# Patient Record
Sex: Female | Born: 1965 | Race: White | Hispanic: No | Marital: Married | Smoking: Never smoker
Health system: Southern US, Community
[De-identification: ages and names within clinical notes are randomized; demographics above are authoritative.]

## PROBLEM LIST (undated history)

## (undated) DIAGNOSIS — K219 Gastro-esophageal reflux disease without esophagitis: Secondary | ICD-10-CM

## (undated) DIAGNOSIS — A879 Viral meningitis, unspecified: Secondary | ICD-10-CM

## (undated) DIAGNOSIS — F419 Anxiety disorder, unspecified: Secondary | ICD-10-CM

## (undated) DIAGNOSIS — T7840XA Allergy, unspecified, initial encounter: Secondary | ICD-10-CM

## (undated) HISTORY — DX: Viral meningitis, unspecified: A87.9

## (undated) HISTORY — DX: Anxiety disorder, unspecified: F41.9

## (undated) HISTORY — DX: Gastro-esophageal reflux disease without esophagitis: K21.9

## (undated) HISTORY — DX: Allergy, unspecified, initial encounter: T78.40XA

---

## 1998-10-25 ENCOUNTER — Inpatient Hospital Stay (HOSPITAL_COMMUNITY): Admission: AD | Admit: 1998-10-25 | Discharge: 1998-10-25 | Payer: Self-pay | Admitting: *Deleted

## 1998-12-25 ENCOUNTER — Inpatient Hospital Stay (HOSPITAL_COMMUNITY): Admission: AD | Admit: 1998-12-25 | Discharge: 1998-12-28 | Payer: Self-pay | Admitting: Obstetrics and Gynecology

## 2000-01-06 ENCOUNTER — Other Ambulatory Visit: Admission: RE | Admit: 2000-01-06 | Discharge: 2000-01-06 | Payer: Self-pay | Admitting: Obstetrics and Gynecology

## 2001-01-07 ENCOUNTER — Other Ambulatory Visit: Admission: RE | Admit: 2001-01-07 | Discharge: 2001-01-07 | Payer: Self-pay | Admitting: Obstetrics and Gynecology

## 2002-04-25 ENCOUNTER — Other Ambulatory Visit: Admission: RE | Admit: 2002-04-25 | Discharge: 2002-04-25 | Payer: Self-pay | Admitting: Obstetrics and Gynecology

## 2003-08-09 ENCOUNTER — Other Ambulatory Visit: Admission: RE | Admit: 2003-08-09 | Discharge: 2003-08-09 | Payer: Self-pay | Admitting: Obstetrics and Gynecology

## 2004-08-14 ENCOUNTER — Other Ambulatory Visit: Admission: RE | Admit: 2004-08-14 | Discharge: 2004-08-14 | Payer: Self-pay | Admitting: Obstetrics and Gynecology

## 2004-11-10 DIAGNOSIS — A879 Viral meningitis, unspecified: Secondary | ICD-10-CM

## 2004-11-10 HISTORY — DX: Viral meningitis, unspecified: A87.9

## 2005-09-29 ENCOUNTER — Inpatient Hospital Stay (HOSPITAL_COMMUNITY): Admission: EM | Admit: 2005-09-29 | Discharge: 2005-10-01 | Payer: Self-pay | Admitting: Emergency Medicine

## 2005-09-29 ENCOUNTER — Ambulatory Visit: Payer: Self-pay | Admitting: Internal Medicine

## 2006-05-15 ENCOUNTER — Other Ambulatory Visit: Admission: RE | Admit: 2006-05-15 | Discharge: 2006-05-15 | Payer: Self-pay | Admitting: Obstetrics and Gynecology

## 2008-09-18 ENCOUNTER — Ambulatory Visit: Payer: Self-pay | Admitting: Obstetrics and Gynecology

## 2008-09-18 ENCOUNTER — Encounter: Payer: Self-pay | Admitting: Obstetrics and Gynecology

## 2008-09-18 ENCOUNTER — Other Ambulatory Visit: Admission: RE | Admit: 2008-09-18 | Discharge: 2008-09-18 | Payer: Self-pay | Admitting: Obstetrics and Gynecology

## 2008-09-19 ENCOUNTER — Encounter: Admission: RE | Admit: 2008-09-19 | Discharge: 2008-09-19 | Payer: Self-pay | Admitting: Obstetrics and Gynecology

## 2010-09-09 ENCOUNTER — Encounter: Admission: RE | Admit: 2010-09-09 | Discharge: 2010-09-09 | Payer: Self-pay | Admitting: Obstetrics and Gynecology

## 2010-12-04 ENCOUNTER — Ambulatory Visit: Admit: 2010-12-04 | Payer: Self-pay | Admitting: Obstetrics and Gynecology

## 2011-03-28 NOTE — Discharge Summary (Signed)
NAME:  Michelle Love, Michelle Love               ACCOUNT NO.:  [id number]   MEDICAL RECORD NO.:  [id number]          PATIENT TYPE:  INP   LOCATION:  5509                         FACILITY:  [facility]   PHYSICIAN:  Michelle Love, M.D.DATE OF BIRTH:  Nov 04, 1966   DATE OF ADMISSION:  09/29/2005  DATE OF DISCHARGE:  10/01/2005                                 DISCHARGE SUMMARY   DISCHARGE DIAGNOSES:  1.  Aseptic meningitis.  2.  Hypokalemia secondary to vomiting, resolved.  3.  Normocytic anemia.   DISCHARGE MEDICATIONS:  1.  Valtrex 1 gram t.i.d. x7 days or until notified by M.D.  2.  Vicodin 500/5, one to two tabs every four to six hours as needed for      pain.  3.  Phenergan 12.5 mg one every four to six hours as needed for nausea.   The patient is instructed to follow up with the [facility]  within a week and also if feeling acutely worse return to the emergency room  ASAP, and also if the patient has any questions she has been given my  personal pager and also the pager of the on call resident to contact [place].   PROCEDURES:  1.  On September 29, 2005, a chest x-ray was performed which showed no active      cardiopulmonary disease.  2.  On September 29, 2005, also a CT of the head was performed without      contrast which showed no acute intracranial abnormality.  3.  On September 29, 2005, a lumbar puncture was performed which showed the      following:  CSF glucose of 54, total protein 51, gram stain of the CSF      showing white blood cells present both PMN and mononuclear cells, no      organisms seen.  A cell count differential was performed on tube 1.  The      CSF was colorless and clear with a white blood cell count of 38, red      blood cell count of 93, segmented neutrophils of 38%, lymphocytes 60%,      monocytes 10%.  No eosinophils or other cells.  A cell count and      differential was performed also on tube  four.  CSF in tube four was      colorless and clear, white  blood cell count 62, red blood cell count 2,      segmented neutrophils 25%, lymphocytes 59%, monos/macrophages 13%,      eosinophils 3%.  A culture of the CSF was performed which the      preliminary growth shows white blood cells present both PMNs and      mononuclears, no organisms seen and it is preliminary.  PCR for      enterococcus, HIV, and HSV were performed on the CSF and those are all      pending.   CONSULTATIONS:  No formal consultations were done, although infectious  disease was curb sided on this case and they gave some recommendations  informally.   HISTORY AND PHYSICAL:  For a full H&P, please consult the chart but in  brief, [name] is a 45 year old white woman who presented to the  emergency room with a 2-day history of severe headache, beginning the  morning of September 27, 2005, that responded only mildly to 600 mg Advil  q.4h. but then not at all.  The headache was constant and frontal and  worsened at times, especially when laying down and was associated with light  sensitivity and some nausea beginning on the prior night but no vomiting.  She felt like she had a low-grade fever subjectively and minor chills but  did not measure her temperature.  She denied diarrhea or constipation.  No  vision changes, no numbness or tingling, or focal weakness.  She felt  generally weak all over and dizzy.  She had not eaten since the night prior  to admission.  Her 69-year-old daughter was diagnosed with strep throat the  week prior to admission and treated with Zithromax.  The patient herself had  a cold one week prior which lasted a week but did not cause her to miss  work, and she denies cough or sneezes.  She has had cold sores rarely in the  past but none recently.   PAST MEDICAL HISTORY:  Noncontributory.  She has had a C-section, vaginal  birth, wisdom teeth extraction, and her last menstrual period was September 22, 2005, and ended a few days prior to admission, and  she does use tampons.   PHYSICAL EXAMINATION:  VITAL SIGNS:  Temperature 97.5, blood pressure 86 to  114 over 51 to 77, pulse 67 to 84, respirations 18 to 22, saturating 100% on  room air.  GENERAL:  She was alert and oriented, mild distress, pleasant and  conversive.  HEENT:  Extraocular muscles were intact.  Pupils equal, round, reactive to  light and accommodation.  Visual fields were intact.  Oropharynx was clear.  Head, she said that when she flexed her neck, although she has full range of  motion her head did hurt.  LUNGS:  Coarse breath sounds with scant wheezes.  HEART:  Rate was regular and so was the rhythm.  No murmurs, rubs, or  gallops.  ABDOMEN:  Nontender, nondistended.  Decreased bowel sounds.  EXTREMITIES:  No pitting bilaterally.  GU:  Not performed.  SKIN:  Warm and dry, except hands were cool and moist.  There were no  rashes, no petechiae noted.  MUSCULOSKELETAL:  She had 5/5 strength in the upper and lower extremities  bilaterally.  NEUROLOGIC:  Cranial nerves II-XII are intact.  Sensation was intact  bilaterally.  She had 1+ deep tendon reflexes in bilateral upper and lower  extremities.  PSYCH:  She was appropriate.   She was influenza A and B negative.  Sodium 135, potassium 3.5, chloride  103, bicarb 25, BUN 9, creatinine 0.7, glucose 97.  Bilirubin 0.5, alk phos  62, AST 16, ALT 10, protein 6.4, albumin 3.6, calcium 8.2.  Hemoglobin 11.7,  hematocrit 34.5, MCV 85.1, white count 5.6, ANC 4 with 71% neutrophils, 16%  lymphocytes, and 12% monocytes.  She had 212 platelets and please refer to  the procedure section for details about the CSF analysis.   HOSPITAL COURSE:  1.  [name] presented to the emergency room with these symptoms and      received a spinal tap and the CSF had already been analyzed by the time      we saw her and it was  felt to be most consistent with aseptic     meningitis, although there was a Preast bit of question because of the       distribution or the breakdown of her white blood cells.  Nevertheless,      she had no focal neurologic signs, was afebrile, remained afebrile      during the hospitalization.  The differential diagnoses for aseptic      meningitis included viral causes namely adenovirus and she had a cold      before but also included HSV, HIV, mumps, CMV, EDV, and non-viral causes      like TB or spirochetal pathogens as well as autoimmune causes which were      felt to be unlikely considering no other symptoms were present.  She did      receive Rocephin in the emergency room and that was 2 grams IV but we      decided not to continue antibiotics because there was no evidence of      bacterial meningitis.  We consulted infectious disease in an informal      fashion and they felt that her presentation was not consistent with      bacterial meningitis.  However, because of the severity of such a      possibility the patient has been provided with two pager numbers for the      team in case she should develop any questions and has been counseled to      return to the ED immediately should she get acutely worse.  She was      treated throughout the hospitalization with acyclovir IV and was      discharged with Valtrex.  The only symptoms she had during her      hospitalization was a headache which was very intense upon presentation      but decreased during the hospitalization and improved with ibuprofen at      the very end.  She did use Vicodin but felt like the ibuprofen was      sufficient.  The plan is to follow up on the CSF culture and on the PCR      for HIV, HSV, and enterovirus and to call her with these results.  If      HSV is negative, I will instruct her to discontinue the Valtrex.  We did      place a PPD and it will be read on October 01, 2005, and she will call      [place] if it is positive.  She is going to have a nurse or doctor read it,      and she will contact our pager if it is  reactive.  2.  Hypokalemia.  This was felt to be secondary to the patient's episodes of      vomiting, and she did vomit on the first night of admission and felt      that it was secondary to the pain medication.  The differential also      included vomiting secondary to the meningitis and it was difficult to      know which one was at fault but she did respond well to Zofran and did      not vomit thereafter, although she did have episodes of nausea.  She      plans to have the Vicodin available and in fact, I discharged on Vicodin      but will try to  control the headache with ibuprofen.  She remained      afebrile throughout the entire hospitalization.  On the day of      discharge, Ms. Totten said her headache had worsened overnight and she      took some Vicodin and felt nauseated but did not vomit but felt much      better this morning and was ready to go home. 3.  Normocytic anemia.  The patient was found to have very slight anemia      hovering around 11 during the hospitalization.  Her last menstrual      period finished right before coming to the hospital and it is probably      due to blood loss from menstruation.  This should be followed up as an      outpatient and if necessary she should be placed on iron if it is in      fact, iron-deficiency anemia.  It could also be anemia secondary to      hydration, thereby being a dilutional anemia or simply from the acute      illness.  Nevertheless, this is something to be followed up as an      outpatient and a ferritin at this time would be expected to be elevated      as an acute phase reactant, therefore, an iron panel as an outpatient      when this acute illness has passed would be more reliable.   DISCHARGE PHYSICAL EXAMINATION:  GENERAL:  Temperature 98.7, blood pressure  95 to 100 over 43 to 63, pulse 63 to 85, respirations 20, sating 98% on room  air.  GENERAL:  She is sleeping but she awoke quickly and was very oriented  and  very pleasant.  HEART:  Rate regular rate and rhythm.  No murmurs, rubs, or gallops.  LUNGS:  Clear to auscultation bilaterally.  ABDOMEN:  Nontender, nondistended.  Positive bowel sounds.  EXTREMITIES:  No pitting edema bilaterally.  NEUROLOGIC:  She had intact cranial nerves II-XII.  Strength was intact in  upper and lower extremities bilaterally.   Hemoglobin 10.7, hematocrit 31.1, white count 4.9, platelets 207.  Sodium  137, potassium 3.7, chloride 102, bicarb 26, BUN 14, creatinine 0.6, and  glucose 101, calcium of 7.5.   PENDING LABORATORY DATA:  Her PCR for enterovirus, HSV, and HIV on the CSF  and also the CSF culture and blood cultures which are all pending.  I will  be contacting the patient directly regarding these laboratories and let her  know if anything comes up.      Clearance Coots, M.D.    ______________________________  Michelle Love, M.D.    IN/MEDQ  D:  10/01/2005  T:  10/01/2005  Job:  [id number]   cc:   [facility]   Rande Brunt. Eda Paschal, M.D.  Fax: [phone]

## 2011-08-11 ENCOUNTER — Other Ambulatory Visit: Payer: Self-pay | Admitting: Obstetrics and Gynecology

## 2011-08-11 DIAGNOSIS — Z1231 Encounter for screening mammogram for malignant neoplasm of breast: Secondary | ICD-10-CM

## 2011-09-17 ENCOUNTER — Ambulatory Visit: Payer: Self-pay

## 2012-02-05 ENCOUNTER — Ambulatory Visit
Admission: RE | Admit: 2012-02-05 | Discharge: 2012-02-05 | Disposition: A | Discharge status: Home / Self Care | Payer: Self-pay | Source: Ambulatory Visit | Attending: Obstetrics and Gynecology | Admitting: Obstetrics and Gynecology

## 2012-02-05 DIAGNOSIS — Z1231 Encounter for screening mammogram for malignant neoplasm of breast: Secondary | ICD-10-CM

## 2012-03-01 ENCOUNTER — Ambulatory Visit: Payer: Self-pay | Admitting: Obstetrics and Gynecology

## 2012-09-24 ENCOUNTER — Ambulatory Visit: Payer: Commercial Managed Care - PPO | Admitting: Gynecology

## 2012-09-29 ENCOUNTER — Ambulatory Visit (INDEPENDENT_AMBULATORY_CARE_PROVIDER_SITE_OTHER): Payer: Commercial Managed Care - PPO | Admitting: Gynecology

## 2012-09-29 ENCOUNTER — Encounter: Payer: Self-pay | Admitting: Gynecology

## 2012-09-29 ENCOUNTER — Other Ambulatory Visit (HOSPITAL_COMMUNITY)
Admission: RE | Admit: 2012-09-29 | Discharge: 2012-09-29 | Disposition: A | Discharge status: Home / Self Care | Payer: Self-pay | Source: Ambulatory Visit | Attending: Gynecology | Admitting: Gynecology

## 2012-09-29 VITALS — BP 104/60 | Ht 65.5 in | Wt 162.0 lb

## 2012-09-29 DIAGNOSIS — Z13220 Encounter for screening for lipoid disorders: Secondary | ICD-10-CM

## 2012-09-29 DIAGNOSIS — N6009 Solitary cyst of unspecified breast: Secondary | ICD-10-CM

## 2012-09-29 DIAGNOSIS — Z01419 Encounter for gynecological examination (general) (routine) without abnormal findings: Secondary | ICD-10-CM

## 2012-09-29 DIAGNOSIS — Z131 Encounter for screening for diabetes mellitus: Secondary | ICD-10-CM

## 2012-09-29 DIAGNOSIS — N920 Excessive and frequent menstruation with regular cycle: Secondary | ICD-10-CM

## 2012-09-29 DIAGNOSIS — Z1151 Encounter for screening for human papillomavirus (HPV): Secondary | ICD-10-CM | POA: Insufficient documentation

## 2012-09-29 LAB — CBC WITH DIFFERENTIAL/PLATELET
Basophils Absolute: 0 10*3/uL (ref 0.0–0.1)
Basophils Relative: 0 % (ref 0–1)
Eosinophils Absolute: 0.2 10*3/uL (ref 0.0–0.7)
Eosinophils Relative: 2 % (ref 0–5)
HCT: 36.4 % (ref 36.0–46.0)
Hemoglobin: 12.1 g/dL (ref 12.0–15.0)
Lymphocytes Relative: 22 % (ref 12–46)
Lymphs Abs: 1.7 10*3/uL (ref 0.7–4.0)
MCH: 28.5 pg (ref 26.0–34.0)
MCHC: 33.2 g/dL (ref 30.0–36.0)
MCV: 85.6 fL (ref 78.0–100.0)
Monocytes Absolute: 0.7 10*3/uL (ref 0.1–1.0)
Monocytes Relative: 10 % (ref 3–12)
Neutro Abs: 4.9 10*3/uL (ref 1.7–7.7)
Neutrophils Relative %: 66 % (ref 43–77)
Platelets: 304 10*3/uL (ref 150–400)
RBC: 4.25 MIL/uL (ref 3.87–5.11)
RDW: 14.1 % (ref 11.5–15.5)
WBC: 7.5 10*3/uL (ref 4.0–10.5)

## 2012-09-29 NOTE — Progress Notes (Signed)
Patient ID: Michelle Love, female   DOB: 1966/05/07, 46 y.o.   MRN: [id number] DUANE TRIAS 1966/09/26 [id number]        46 y.o.  G2P2 for annual exam.  Has not been in the office in a number of years.  Past medical history,surgical history, medications, allergies, family history and social history were all reviewed and documented in the EPIC chart. ROS:  Was performed and pertinent positives and negatives are included in the history.  Exam: Sherrilyn Rist assistant Filed Vitals:   09/29/12 1508  BP: 104/60  Height: 5' 5.5" (1.664 m)  Weight: 162 lb (73.483 kg)   General appearance  Normal Skin grossly normal Head/Neck normal with no cervical or supraclavicular adenopathy thyroid normal Lungs  clear Cardiac RR, without RMG Abdominal  soft, nontender, without masses, organomegaly or hernia Breasts  examined lying and sitting.  Left without masses, retractions, discharge or axillary adenopathy.  Right with cystic feeling 2 cm mass tail of Spence region no overlying skin changes nipple discharge or axillary adenopathy Physical Exam  Pulmonary/Chest:      Pelvic  Ext/BUS/vagina  normal   Cervix  normal Pap/HPV, scant menses flow  Uterus  retroverted, normal size, shape and contour, midline and mobile nontender   Adnexa  Without masses or tenderness    Anus and perineum  normal   Rectovaginal  normal sphincter tone without palpated masses or tenderness.    Assessment/Plan:  46 y.o. G2P2 female for annual exam.   1. Right breast cyst. Present over last several days tender to the patient. Overlying skin cleansed with alcohol, infiltrated with 1% lidocaine and 15 cc of light green fluid extracted and discarded. Mass completely disappeared. Patient will continue with self breast exams and as long as no recurrence or any other masses will follow. She is due for her mammogram in April 2014. She has very dense breasts bilaterally and I have recommended 3-D mammography when she does have this done  and she will arrange this. 2. Pap smear. Pap/HPV done today as her last Pap smear was 2009. No history of abnormal Paps previously. Assuming negative and plan less frequent screening interval. 3. Birth control. Patient using condoms. Discussed options to include hormonal such as birth control pills IUD both Mirena and ParaGard and sterilization. Patient's husband has been planning vasectomy but has never followed through. She also has heavier periods lasting up to 7 days. No intermenstrual bleeding. Strongly recommended she consider Mirena IUD that will address both her contraception and heavier menses. Literature was given. I discussed the insertional process as well as the risks to include perforation/migration requiring surgery to remove, infection, failure with pregnancy. Patient will follow up for this if she decides or alternatives at her choice.  She does recognize the failure risk with condoms. 4. Health maintenance. Baseline CBC glucose lipid profile urinalysis ordered. Follow up for contraception decision otherwise in one year.    Dara Lords MD, 4:55 PM 09/29/2012

## 2012-09-29 NOTE — Patient Instructions (Signed)
Follow up for Mirena IUD at your choice. Schedule 3-D mammography in April 2014 Follow up in one year for annual checkup

## 2012-09-30 LAB — LIPID PANEL
Cholesterol: 200 mg/dL (ref 0–200)
HDL: 66 mg/dL (ref >39)
LDL Cholesterol: 122 mg/dL — ABNORMAL HIGH (ref 0–99)
Total CHOL/HDL Ratio: 3 Ratio
Triglycerides: 59 mg/dL (ref <150)
VLDL: 12 mg/dL (ref 0–40)

## 2012-09-30 LAB — GLUCOSE, RANDOM: Glucose, Bld: 91 mg/dL (ref 70–99)

## 2012-09-30 LAB — URINALYSIS W MICROSCOPIC + REFLEX CULTURE
Bacteria, UA: NONE SEEN
Bilirubin Urine: NEGATIVE
Casts: NONE SEEN
Glucose, UA: NEGATIVE mg/dL
Hgb urine dipstick: NEGATIVE
Leukocytes, UA: NEGATIVE
Nitrite: NEGATIVE
Protein, ur: NEGATIVE mg/dL
Specific Gravity, Urine: >1.03 — ABNORMAL HIGH (ref 1.005–1.030)
Squamous Epithelial / HPF: NONE SEEN
Urobilinogen, UA: 0.2 mg/dL (ref 0.0–1.0)
pH: 5.5 (ref 5.0–8.0)

## 2013-01-10 ENCOUNTER — Other Ambulatory Visit: Payer: Self-pay

## 2013-01-10 ENCOUNTER — Ambulatory Visit (INDEPENDENT_AMBULATORY_CARE_PROVIDER_SITE_OTHER): Payer: Commercial Managed Care - PPO | Admitting: Gynecology

## 2013-01-10 ENCOUNTER — Encounter: Payer: Self-pay | Admitting: Gynecology

## 2013-01-10 DIAGNOSIS — N6001 Solitary cyst of right breast: Secondary | ICD-10-CM

## 2013-01-10 DIAGNOSIS — Z1231 Encounter for screening mammogram for malignant neoplasm of breast: Secondary | ICD-10-CM

## 2013-01-10 DIAGNOSIS — N6009 Solitary cyst of unspecified breast: Secondary | ICD-10-CM

## 2013-01-10 NOTE — Patient Instructions (Signed)
Schedule 3D mammography.

## 2013-01-10 NOTE — Progress Notes (Signed)
Patient ID: Michelle Love, female   DOB: 10/27/1966, 47 y.o.   MRN: [id number] Patient presents complaining of tender lump in her right breast. Does have history of aspiration of breast cyst 09/2012. Feels to be in a different area than where we aspirated the cyst previously by patient history.  Exam with Selena Batten assistant Both breasts examined lying and sitting.  Left breast with cystic mass 2-3 cm 2 fingerbreadths off the areola at the 2:00 to 3:00 periphery of the breast. No overlying skin changes nipple discharge or axillary adenopathy.  Right breast with 3 palpable cystic masses. 2-3 cm at 12:00 1 fingerbreadth off the areola. 2 cm 10:00 2 fingerbreadths off the areola. 2 cm 5:00 2 fingerbreadths off the areola. No nipple discharge overlying skin changes or axillary adenopathy. Both breasts are extremely dense to palpation.  Physical Exam  Pulmonary/Chest:      Procedure: Left breast: area cleansed with alcohol, 1% lidocaine infiltration and cyst was aspirated of clear yellow fluid approximately 9 cc. Aspirated area completely resolved with no residual palpable abnormality. Right breast: 3 areas cleansed with alcohol, 1% lidocaine infiltration and classic clear greenish cyst fluid was aspirated from all 3 areas. Total of approximately 20 cc. All aspirated areas completely resolved with no residual palpable abnormalities.  All cyst fluid was discarded.  Patient due for her mammogram now and I recommended she schedule a 3-D mammography as she does have very dense breast and she agrees to do so.

## 2013-02-07 ENCOUNTER — Ambulatory Visit: Payer: Commercial Managed Care - PPO

## 2014-05-01 ENCOUNTER — Other Ambulatory Visit: Payer: Self-pay

## 2014-05-01 DIAGNOSIS — Z1231 Encounter for screening mammogram for malignant neoplasm of breast: Secondary | ICD-10-CM

## 2014-05-01 DIAGNOSIS — Z803 Family history of malignant neoplasm of breast: Secondary | ICD-10-CM

## 2014-05-11 ENCOUNTER — Encounter (INDEPENDENT_AMBULATORY_CARE_PROVIDER_SITE_OTHER): Payer: Self-pay

## 2014-05-11 ENCOUNTER — Ambulatory Visit
Admission: RE | Admit: 2014-05-11 | Discharge: 2014-05-11 | Disposition: A | Discharge status: Home / Self Care | Payer: Commercial Managed Care - PPO | Source: Ambulatory Visit

## 2014-05-11 DIAGNOSIS — Z803 Family history of malignant neoplasm of breast: Secondary | ICD-10-CM

## 2014-05-11 DIAGNOSIS — Z1231 Encounter for screening mammogram for malignant neoplasm of breast: Secondary | ICD-10-CM

## 2014-05-16 ENCOUNTER — Other Ambulatory Visit: Payer: Self-pay | Admitting: Gynecology

## 2014-05-16 DIAGNOSIS — R928 Other abnormal and inconclusive findings on diagnostic imaging of breast: Secondary | ICD-10-CM

## 2014-05-19 ENCOUNTER — Other Ambulatory Visit: Payer: Self-pay

## 2014-05-19 ENCOUNTER — Other Ambulatory Visit: Payer: Self-pay | Admitting: Gynecology

## 2014-05-19 DIAGNOSIS — R928 Other abnormal and inconclusive findings on diagnostic imaging of breast: Secondary | ICD-10-CM

## 2014-05-24 ENCOUNTER — Ambulatory Visit
Admission: RE | Admit: 2014-05-24 | Discharge: 2014-05-24 | Disposition: A | Discharge status: Home / Self Care | Payer: Commercial Managed Care - PPO | Source: Ambulatory Visit | Attending: Gynecology | Admitting: Gynecology

## 2014-05-24 ENCOUNTER — Other Ambulatory Visit: Payer: Self-pay | Admitting: Gynecology

## 2014-05-24 DIAGNOSIS — R928 Other abnormal and inconclusive findings on diagnostic imaging of breast: Secondary | ICD-10-CM

## 2014-05-29 ENCOUNTER — Ambulatory Visit
Admission: RE | Admit: 2014-05-29 | Discharge: 2014-05-29 | Disposition: A | Discharge status: Home / Self Care | Payer: Commercial Managed Care - PPO | Source: Ambulatory Visit | Attending: Gynecology | Admitting: Gynecology

## 2014-05-29 ENCOUNTER — Other Ambulatory Visit: Payer: Self-pay | Admitting: Gynecology

## 2014-05-29 DIAGNOSIS — R928 Other abnormal and inconclusive findings on diagnostic imaging of breast: Secondary | ICD-10-CM

## 2014-05-29 HISTORY — PX: BREAST BIOPSY: SHX20

## 2014-06-09 ENCOUNTER — Encounter: Payer: Self-pay | Admitting: Gynecology

## 2014-08-03 ENCOUNTER — Encounter: Payer: Self-pay | Admitting: Gynecology

## 2014-09-11 ENCOUNTER — Encounter: Payer: Self-pay | Admitting: Gynecology

## 2015-06-13 ENCOUNTER — Encounter: Payer: Self-pay | Admitting: Gynecology

## 2015-06-13 ENCOUNTER — Telehealth: Payer: Self-pay | Admitting: *Deleted

## 2015-06-13 ENCOUNTER — Ambulatory Visit (INDEPENDENT_AMBULATORY_CARE_PROVIDER_SITE_OTHER): Payer: Commercial Managed Care - PPO | Admitting: Gynecology

## 2015-06-13 VITALS — BP 114/70 | Ht 65.5 in | Wt 154.0 lb

## 2015-06-13 DIAGNOSIS — N926 Irregular menstruation, unspecified: Secondary | ICD-10-CM | POA: Diagnosis not present

## 2015-06-13 DIAGNOSIS — N951 Menopausal and female climacteric states: Secondary | ICD-10-CM

## 2015-06-13 DIAGNOSIS — Z01419 Encounter for gynecological examination (general) (routine) without abnormal findings: Secondary | ICD-10-CM

## 2015-06-13 DIAGNOSIS — N6310 Unspecified lump in the right breast, unspecified quadrant: Secondary | ICD-10-CM

## 2015-06-13 DIAGNOSIS — N6320 Unspecified lump in the left breast, unspecified quadrant: Secondary | ICD-10-CM

## 2015-06-13 LAB — PREGNANCY, URINE: Preg Test, Ur: NEGATIVE

## 2015-06-13 LAB — TSH: TSH: 2.014 u[IU]/mL (ref 0.350–4.500)

## 2015-06-13 NOTE — Progress Notes (Addendum)
Michelle Love [birth year] [id number]        49 y.o.  G2P2002 for annual exam.  Several issues noted below.  Past medical history,surgical history, problem list, medications, allergies, family history and social history were all reviewed and documented as reviewed in the EPIC chart.  ROS:  Performed with pertinent positives and negatives included in the history, assessment and plan.   Additional significant findings :  none   Exam: Kim Counsellor Vitals:   06/13/15 1153  BP: 114/70  Height: 5' 5.5" (1.664 m)  Weight: 154 lb (69.854 kg)   General appearance:  Normal affect, orientation and appearance. Skin: Grossly normal HEENT: Without gross lesions.  No cervical or supraclavicular adenopathy. Thyroid normal.  Lungs:  Clear without wheezing, rales or rhonchi Cardiac: RR, without RMG Abdominal:  Soft, nontender, without masses, guarding, rebound, organomegaly or hernia Breasts:  Examined lying and sitting. Right with 3 cystic feeling masses as diagrammed below. No overlying skin changes, nipple discharge or axillary adenopathy. Left with 2 cystic feeling masses as diagrammed below. No nipple discharge or axillary adenopathy. Physical Exam  Pulmonary/Chest:      Pelvic:  Ext/BUS/vagina normal  Cervix normal  Uterus anteverted, normal size, shape and contour, midline and mobile nontender   Adnexa  Without masses or tenderness    Anus and perineum  Normal   Rectovaginal  Normal sphincter tone without palpated masses or tenderness.    Assessment/Plan:  49 y.o. G59P2002 female for annual exam with irregular menses, condom contraception.   1. Irregular menses/menopausal symptoms. Patient notes over the last 1-2 years she's had several skips in menses. No prolonged or atypical bleeding but just missed the month. Now with possible she is and night sweats as well as dry skin. We'll check baseline TSH FSH. Options to include OTC products such as soy based, HRT or possible low-dose  oral contraceptives for both menstrual regulation and symptom relief all discussed. The risks/benefits reviewed. WHI study with increased risk of stroke heart attack DVT and breast cancer all discussed. Patient will follow up for her hormone test results and she wants to think about her options and will follow up with her decision. 2. Bilateral breast masses most likely cysts. History of multiple breast cysts in the past with aspiration.  Discussed aspiration now versus diagnostic mammography and ultrasound first. If all consistent with benign appearance leaving them undisturbed given her past history of recurrence also discussed. The issues of masking a breast cancer in proximity to a cyst if left undisturbed also discussed. At this point the patient is not interested in having them aspirated but will follow up for the mammogram and ultrasound and then we'll go from there. We also discussed the issues of MRI in very dense breasts. She does have a maternal aunt and maternal grandmother with breast cancer.  She wants to think about that issue also and we'll see what the mammogram and ultrasound show. 3. Contraception. Patient using condoms which is acceptable to her. We have discussed contraceptive options and she is comfortable with condoms recognizing the failure risk. 4. Pap smear/HPV negative 2013. No Pap smear done today. No history of abnormal Pap smears. Plan repeat at 5 year interval per current screening guidelines. 5. Health maintenance. No routine blood work done as patient has this done at her primary physician's office. Follow up for hormone results and her breast studies otherwise annually.   Anastasio Auerbach MD, 12:27 PM 06/13/2015

## 2015-06-13 NOTE — Telephone Encounter (Signed)
Appointment on 06/18/15 @ 10:00am left message for pt to call.

## 2015-06-13 NOTE — Telephone Encounter (Signed)
-----   Message from Anastasio Auerbach, MD sent at 06/13/2015 12:36 PM EDT ----- Schedule bilateral diagnostic mammography and ultrasound. Bilateral breast masses as described below.  Left 3 cm tail of Spence, 2 cm 7:00 position off areola Right 3 cm 1:00 off areola, 3 cm tail of Spence, 2 cm 6 clock off areola

## 2015-06-13 NOTE — Telephone Encounter (Signed)
Pt informed with the below. 

## 2015-06-13 NOTE — Patient Instructions (Signed)
Office will call you to arrange mammogram/ultrasound. Office will call you with the hormone test results.  You may obtain a copy of any labs that were done today by logging onto MyChart as outlined in the instructions provided with your AVS (after visit summary). The office will not call with normal lab results but certainly if there are any significant abnormalities then we will contact you.   Health Maintenance, Female A healthy lifestyle and preventative care can promote health and wellness.  Maintain regular health, dental, and eye exams.  Eat a healthy diet. Foods like vegetables, fruits, whole grains, low-fat dairy products, and lean protein foods contain the nutrients you need without too many calories. Decrease your intake of foods high in solid fats, added sugars, and salt. Get information about a proper diet from your caregiver, if necessary.  Regular physical exercise is one of the most important things you can do for your health. Most adults should get at least 150 minutes of moderate-intensity exercise (any activity that increases your heart rate and causes you to sweat) each week. In addition, most adults need muscle-strengthening exercises on 2 or more days a week.   Maintain a healthy weight. The body mass index (BMI) is a screening tool to identify possible weight problems. It provides an estimate of body fat based on height and weight. Your caregiver can help determine your BMI, and can help you achieve or maintain a healthy weight. For adults 20 years and older:  A BMI below 18.5 is considered underweight.  A BMI of 18.5 to 24.9 is normal.  A BMI of 25 to 29.9 is considered overweight.  A BMI of 30 and above is considered obese.  Maintain normal blood lipids and cholesterol by exercising and minimizing your intake of saturated fat. Eat a balanced diet with plenty of fruits and vegetables. Blood tests for lipids and cholesterol should begin at age 96 and be repeated every 5  years. If your lipid or cholesterol levels are high, you are over 50, or you are a high risk for heart disease, you may need your cholesterol levels checked more frequently.Ongoing high lipid and cholesterol levels should be treated with medicines if diet and exercise are not effective.  If you smoke, find out from your caregiver how to quit. If you do not use tobacco, do not start.  Lung cancer screening is recommended for adults aged 23 80 years who are at high risk for developing lung cancer because of a history of smoking. Yearly low-dose computed tomography (CT) is recommended for people who have at least a 30-pack-year history of smoking and are a current smoker or have quit within the past 15 years. A pack year of smoking is smoking an average of 1 pack of cigarettes a day for 1 year (for example: 1 pack a day for 30 years or 2 packs a day for 15 years). Yearly screening should continue until the smoker has stopped smoking for at least 15 years. Yearly screening should also be stopped for people who develop a health problem that would prevent them from having lung cancer treatment.  If you are pregnant, do not drink alcohol. If you are breastfeeding, be very cautious about drinking alcohol. If you are not pregnant and choose to drink alcohol, do not exceed 1 drink per day. One drink is considered to be 12 ounces (355 mL) of beer, 5 ounces (148 mL) of wine, or 1.5 ounces (44 mL) of liquor.  Avoid use of street drugs.  Do not share needles with anyone. Ask for help if you need support or instructions about stopping the use of drugs.  High blood pressure causes heart disease and increases the risk of stroke. Blood pressure should be checked at least every 1 to 2 years. Ongoing high blood pressure should be treated with medicines, if weight loss and exercise are not effective.  If you are 90 to 47 years old, ask your caregiver if you should take aspirin to prevent strokes.  Diabetes screening  involves taking a blood sample to check your fasting blood sugar level. This should be done once every 3 years, after age 20, if you are within normal weight and without risk factors for diabetes. Testing should be considered at a younger age or be carried out more frequently if you are overweight and have at least 1 risk factor for diabetes.  Breast cancer screening is essential preventative care for women. You should practice "breast self-awareness." This means understanding the normal appearance and feel of your breasts and may include breast self-examination. Any changes detected, no matter how small, should be reported to a caregiver. Women in their 59s and 30s should have a clinical breast exam (CBE) by a caregiver as part of a regular health exam every 1 to 3 years. After age 77, women should have a CBE every year. Starting at age 66, women should consider having a mammogram (breast X-ray) every year. Women who have a family history of breast cancer should talk to their caregiver about genetic screening. Women at a high risk of breast cancer should talk to their caregiver about having an MRI and a mammogram every year.  Breast cancer gene (BRCA)-related cancer risk assessment is recommended for women who have family members with BRCA-related cancers. BRCA-related cancers include breast, ovarian, tubal, and peritoneal cancers. Having family members with these cancers may be associated with an increased risk for harmful changes (mutations) in the breast cancer genes BRCA1 and BRCA2. Results of the assessment will determine the need for genetic counseling and BRCA1 and BRCA2 testing.  The Pap test is a screening test for cervical cancer. Women should have a Pap test starting at age 23. Between ages 34 and 55, Pap tests should be repeated every 2 years. Beginning at age 50, you should have a Pap test every 3 years as long as the past 3 Pap tests have been normal. If you had a hysterectomy for a problem that  was not cancer or a condition that could lead to cancer, then you no longer need Pap tests. If you are between ages 77 and 70, and you have had normal Pap tests going back 10 years, you no longer need Pap tests. If you have had past treatment for cervical cancer or a condition that could lead to cancer, you need Pap tests and screening for cancer for at least 20 years after your treatment. If Pap tests have been discontinued, risk factors (such as a new sexual partner) need to be reassessed to determine if screening should be resumed. Some women have medical problems that increase the chance of getting cervical cancer. In these cases, your caregiver may recommend more frequent screening and Pap tests.  The human papillomavirus (HPV) test is an additional test that may be used for cervical cancer screening. The HPV test looks for the virus that can cause the cell changes on the cervix. The cells collected during the Pap test can be tested for HPV. The HPV test could be used  to screen women aged 70 years and older, and should be used in women of any age who have unclear Pap test results. After the age of 7, women should have HPV testing at the same frequency as a Pap test.  Colorectal cancer can be detected and often prevented. Most routine colorectal cancer screening begins at the age of 66 and continues through age 58. However, your caregiver may recommend screening at an earlier age if you have risk factors for colon cancer. On a yearly basis, your caregiver may provide home test kits to check for hidden blood in the stool. Use of a small camera at the end of a tube, to directly examine the colon (sigmoidoscopy or colonoscopy), can detect the earliest forms of colorectal cancer. Talk to your caregiver about this at age 41, when routine screening begins. Direct examination of the colon should be repeated every 5 to 10 years through age 64, unless early forms of pre-cancerous polyps or small growths are  found.  Hepatitis C blood testing is recommended for all people born from [birth year] through 1963 and any individual with known risks for hepatitis C.  Practice safe sex. Use condoms and avoid high-risk sexual practices to reduce the spread of sexually transmitted infections (STIs). Sexually active women aged 48 and younger should be checked for Chlamydia, which is a common sexually transmitted infection. Older women with new or multiple partners should also be tested for Chlamydia. Testing for other STIs is recommended if you are sexually active and at increased risk.  Osteoporosis is a disease in which the bones lose minerals and strength with aging. This can result in serious bone fractures. The risk of osteoporosis can be identified using a bone density scan. Women ages 38 and over and women at risk for fractures or osteoporosis should discuss screening with their caregivers. Ask your caregiver whether you should be taking a calcium supplement or vitamin D to reduce the rate of osteoporosis.  Menopause can be associated with physical symptoms and risks. Hormone replacement therapy is available to decrease symptoms and risks. You should talk to your caregiver about whether hormone replacement therapy is right for you.  Use sunscreen. Apply sunscreen liberally and repeatedly throughout the day. You should seek shade when your shadow is shorter than you. Protect yourself by wearing long sleeves, pants, a wide-brimmed hat, and sunglasses year round, whenever you are outdoors.  Notify your caregiver of new moles or changes in moles, especially if there is a change in shape or color. Also notify your caregiver if a mole is larger than the size of a pencil eraser.  Stay current with your immunizations. Document Released: 05/12/2011 Document Revised: 02/21/2013 Document Reviewed: 05/12/2011 [facility] Patient Information 2014 [place].

## 2015-06-14 ENCOUNTER — Other Ambulatory Visit: Payer: Self-pay | Admitting: Gynecology

## 2015-06-14 LAB — FOLLICLE STIMULATING HORMONE: FSH: 69.5 m[IU]/mL

## 2015-06-14 MED ORDER — NORETHIN-ETH ESTRAD-FE BIPHAS 1 MG-10 MCG / 10 MCG PO TABS: 1.0000 | ORAL_TABLET | Freq: Every day | ORAL | Status: DC

## 2015-06-18 ENCOUNTER — Ambulatory Visit
Admission: RE | Admit: 2015-06-18 | Discharge: 2015-06-18 | Disposition: A | Discharge status: Home / Self Care | Payer: Commercial Managed Care - PPO | Source: Ambulatory Visit | Attending: Gynecology | Admitting: Gynecology

## 2015-06-18 DIAGNOSIS — N6310 Unspecified lump in the right breast, unspecified quadrant: Secondary | ICD-10-CM

## 2015-06-18 DIAGNOSIS — N6320 Unspecified lump in the left breast, unspecified quadrant: Secondary | ICD-10-CM

## 2015-06-20 ENCOUNTER — Telehealth: Payer: Self-pay | Admitting: *Deleted

## 2015-06-20 DIAGNOSIS — R922 Inconclusive mammogram: Secondary | ICD-10-CM

## 2015-06-20 DIAGNOSIS — R9230 Dense breasts, unspecified: Secondary | ICD-10-CM

## 2015-06-20 DIAGNOSIS — Z803 Family history of malignant neoplasm of breast: Secondary | ICD-10-CM

## 2015-06-20 NOTE — Telephone Encounter (Signed)
Spoke with patient and told her that [facility] will contact her to schedule appointment, and she can call [facility] to schedule MRI. Left # [phone] on pt voicemail per her request and she will schedule.

## 2015-06-20 NOTE — Telephone Encounter (Signed)
-----   Message from Ramond Craver, [place] sent at 06/19/2015 10:40 AM EDT ----- Regarding: referral to Genetic Counselor and for MRI Per Dr. Loetta Rough "The radiologist had recommended considering MRI because of the dense breasts. If she is interested in pursuing this then we will schedule this for her. Also they mentioned breast cancer in her mother and maternal aunt and maternal grandmother. We have documented her maternal aunt and maternal grandmother but not her mother. Did her mother had breast cancer? She may want to consider genetic counseling also as far as being tested for the "breast cancer gene" given this family history. If she is interested in discussing this with a genetic counselor set that appointment up also."   I spoke with patient. She wants both appointments. Thanks!!

## 2016-03-05 ENCOUNTER — Emergency Department (HOSPITAL_COMMUNITY)
Admission: EM | Admit: 2016-03-05 | Discharge: 2016-03-05 | Disposition: A | Discharge status: Home / Self Care | Payer: Commercial Managed Care - PPO | Attending: Emergency Medicine | Admitting: Emergency Medicine

## 2016-03-05 ENCOUNTER — Ambulatory Visit (INDEPENDENT_AMBULATORY_CARE_PROVIDER_SITE_OTHER): Payer: Commercial Managed Care - PPO | Admitting: Emergency Medicine

## 2016-03-05 ENCOUNTER — Ambulatory Visit: Payer: Commercial Managed Care - PPO

## 2016-03-05 ENCOUNTER — Emergency Department (HOSPITAL_COMMUNITY): Payer: Commercial Managed Care - PPO

## 2016-03-05 ENCOUNTER — Encounter (HOSPITAL_COMMUNITY): Payer: Self-pay

## 2016-03-05 VITALS — BP 114/72 | HR 71 | Temp 98.0°F | Resp 16 | Ht 61.0 in | Wt 160.0 lb

## 2016-03-05 DIAGNOSIS — F419 Anxiety disorder, unspecified: Secondary | ICD-10-CM | POA: Insufficient documentation

## 2016-03-05 DIAGNOSIS — R062 Wheezing: Secondary | ICD-10-CM | POA: Diagnosis not present

## 2016-03-05 DIAGNOSIS — R0789 Other chest pain: Secondary | ICD-10-CM

## 2016-03-05 DIAGNOSIS — R079 Chest pain, unspecified: Secondary | ICD-10-CM | POA: Diagnosis present

## 2016-03-05 DIAGNOSIS — Z79899 Other long term (current) drug therapy: Secondary | ICD-10-CM | POA: Diagnosis not present

## 2016-03-05 LAB — CBC
HCT: 36.6 % (ref 36.0–46.0)
HEMOGLOBIN: 12 g/dL (ref 12.0–15.0)
MCH: 28.9 pg (ref 26.0–34.0)
MCHC: 32.8 g/dL (ref 30.0–36.0)
MCV: 88.2 fL (ref 78.0–100.0)
PLATELETS: 321 10*3/uL (ref 150–400)
RBC: 4.15 MIL/uL (ref 3.87–5.11)
RDW: 13.9 % (ref 11.5–15.5)
WBC: 5.4 10*3/uL (ref 4.0–10.5)

## 2016-03-05 LAB — BASIC METABOLIC PANEL
ANION GAP: 9 (ref 5–15)
BUN: 11 mg/dL (ref 6–20)
CALCIUM: 8.9 mg/dL (ref 8.9–10.3)
CHLORIDE: 104 mmol/L (ref 101–111)
CO2: 26 mmol/L (ref 22–32)
CREATININE: 0.7 mg/dL (ref 0.44–1.00)
GFR calc non Af Amer: >60 mL/min (ref >60)
Glucose, Bld: 96 mg/dL (ref 65–99)
Potassium: 4.2 mmol/L (ref 3.5–5.1)
SODIUM: 139 mmol/L (ref 135–145)

## 2016-03-05 LAB — I-STAT TROPONIN, ED: TROPONIN I, POC: 0 ng/mL (ref 0.00–0.08)

## 2016-03-05 MED ORDER — ASPIRIN 81 MG PO CHEW
324.0000 mg | CHEWABLE_TABLET | Freq: Once | ORAL | Status: AC
  Administered 2016-03-05: 324 mg via ORAL

## 2016-03-05 NOTE — Progress Notes (Signed)
03/05/2016 12:37 PM   DOB: [birth year] / MRN: [id number]  SUBJECTIVE:  Michelle Love is a 50 y.o. female presenting for chest pressure that started this morning.  Reports the symptoms have improved since the episode began.  Complains that she has some pain in her right jaw however this is not abnormal for her.  She denies any SOB, DOE, presyncope, diaphoresis and nausea with the chest pain.  She has a history of allergies.  Denies night time cough and has no had any acute illness in the last month. She is a Pharmacist, hospital. She is a   She is allergic to codeine.   She  has a past medical history of Viral meningitis (2006); Allergy; and Anxiety.    She  reports that she has never smoked. She has never used smokeless tobacco. She reports that she drinks alcohol. She reports that she does not use illicit drugs. She  reports that she currently engages in sexual activity. She reports using the following method of birth control/protection: Condom. The patient  has past surgical history that includes Cesarean section.  Her family history includes Breast cancer in her maternal aunt and maternal grandmother; Cancer in her other; Diabetes in her brother and father; Hypertension in her brother, father, and paternal grandmother; Stroke in her paternal grandmother.  Review of Systems  Constitutional: Negative for fever.  Respiratory: Negative for cough, shortness of breath and wheezing.   Cardiovascular: Positive for chest pain. Negative for leg swelling.  Gastrointestinal: Negative for nausea.  Skin: Negative for rash.  Neurological: Negative for dizziness.  Psychiatric/Behavioral: Negative for depression.    Problem list and medications reviewed and updatded by myself where necessary, and exist elsewhere in the encounter.   OBJECTIVE:  BP 111/73 mmHg  Pulse 61  Temp(Src) 98 F (36.7 C) (Oral)  Resp 16  Ht 5\' 1"  (1.549 m)  Wt 160 lb (72.576 kg)  BMI 30.25 kg/m2  SpO2 98%  LMP 02/26/2016  (Approximate)  Physical Exam  Constitutional: She is oriented to person, place, and time. She appears well-nourished. No distress.  Eyes: EOM are normal. Pupils are equal, round, and reactive to light.  Cardiovascular: Normal rate, regular rhythm and normal heart sounds.   Pulmonary/Chest: Effort normal. She has wheezes (right sided).  Abdominal: She exhibits no distension.  Musculoskeletal: Normal range of motion.  Neurological: She is alert and oriented to person, place, and time. No cranial nerve deficit. Gait normal.  Skin: Skin is dry. She is not diaphoretic.  Psychiatric: She has a normal mood and affect.  Vitals reviewed.   EKG: NSR.  T-wave inversion noted in chest leads 1-3.      No results found for this or any previous visit (from the past 72 hour(s)).  No results found.  ASSESSMENT AND PLAN  Michelle Love was seen today for chest pressure.  Diagnoses and all orders for this visit:  Chest tightness or pressure: She has t-wave inversion in the chest leads. Peripheral IV started in the left antecubital and she has received 324 mg of ASA.  Will send her to the ED via emergent transport.   -     EKG 12-Lead -     POCT CBC -     DG Chest 2 View; Future  Wheezing on expiration: Will hold off on Nebs now.      The patient was advised to call or return to clinic if she does not see an improvement in symptoms or to seek the care of  the closest emergency department if she worsens with the above plan.   Philis Fendt, MHS, PA-C [facility] 03/05/2016 12:37 PM

## 2016-03-05 NOTE — ED Provider Notes (Signed)
CSN: [id number]     Arrival date & time 03/05/16  1329 History   First MD Initiated Contact with Patient 03/05/16 1343     Chief Complaint  Patient presents with  . Chest Pain     (Consider location/radiation/quality/duration/timing/severity/associated sxs/prior Treatment) HPI   Patient is a 50 year old female with a history of anxiety presents to the ED with chest pain that started around roughly 11 AM this morning. 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 She took Tums with no relief. She states the pain lasted for 5-10 minutes and then started to dissipate. Total pain only lasted for roughly 15-20 minutes. She went to her primary care provider who performed an EKG and T-wave inversions and told her to come to the emergency department to be evaluated. Patient states she is currently not in any pain and feels great. She denies shortness of breath, dizziness, syncope, visual changes, numbness, weakness, nausea, vomiting, or abdominal pain. She denies recent travel, estrogen use, recent surgery, history of DVT, swelling or pain in her calves.  Past Medical History  Diagnosis Date  . Viral meningitis 2006  . Allergy   . Anxiety    Past Surgical History  Procedure Laterality Date  . Cesarean section     Family History  Problem Relation Age of Onset  . Diabetes Father     type 2  . Hypertension Father   . Diabetes Brother     type 1  . Breast cancer Maternal Aunt     thinks it was before menopause  . Breast cancer Maternal Grandmother     post menopause  . Hypertension Paternal Grandmother   . Stroke Paternal Grandmother   . Hypertension Brother   . Cancer Other     colon   Social History  Substance Use Topics  . Smoking status: Never Smoker   . Smokeless tobacco: Never Used  . Alcohol Use: 0.0 oz/week    0 Standard drinks or equivalent per week     Comment:  twice weekly   OB History    Gravida Para Term Preterm AB TAB SAB Ectopic Multiple Living   2 2 2       2      Review of Systems  Eyes: Negative for visual disturbance.  Respiratory: Positive for chest tightness. Negative for cough and shortness of breath.   Cardiovascular: Positive for chest pain. Negative for leg swelling.  Gastrointestinal: Negative for abdominal pain.  Neurological: Negative for dizziness, syncope, speech difficulty, weakness, light-headedness, numbness and headaches.  All other systems reviewed and are negative.     Allergies  Codeine  Home Medications   Prior to Admission medications   Medication Sig Start Date End Date Taking? Authorizing Provider  calcium carbonate (TUMS - DOSED IN MG ELEMENTAL CALCIUM) 500 MG chewable tablet Chew 1 tablet by mouth 2 (two) times daily as needed for indigestion or heartburn.   Yes Historical Provider, MD  citalopram (CELEXA) 20 MG tablet Take 20 mg by mouth daily.   Yes Historical Provider, MD  diphenhydrAMINE (BENADRYL) 25 MG tablet Take 25 mg by mouth every 6 (six) hours as needed.   Yes Historical Provider, MD  ibuprofen (ADVIL,MOTRIN) 200 MG tablet Take 400 mg by mouth every 6 (six) hours as needed for headache.   Yes Historical Provider, MD   BP 110/74 mmHg  Pulse 64  Temp(Src) 98.2  F (36.8 C) (Oral)  Resp 17  SpO2 98%  LMP 02/26/2016 (Approximate) Physical Exam  Constitutional: She is oriented to person, place, and time. She appears well-developed and well-nourished. No distress.  HENT:  Head: Normocephalic and atraumatic.  Mouth/Throat: Uvula is midline, oropharynx is clear and moist and mucous membranes are normal.  Eyes: Conjunctivae are normal. Pupils are equal, round, and reactive to light.  Neck: Normal range of motion.  Cardiovascular: Normal rate, regular rhythm and normal heart sounds.   No murmur heard. Pulses:      Radial pulses are 2+ on the right side, and 2+ on the left side.       Dorsalis  pedis pulses are 2+ on the right side, and 2+ on the left side.  Pulmonary/Chest: Effort normal and breath sounds normal. She has no wheezes.  Abdominal: Soft. Bowel sounds are normal. She exhibits no distension. There is no tenderness.  Musculoskeletal: Normal range of motion. She exhibits no edema.  Neurological: She is alert and oriented to person, place, and time. Coordination normal.  Skin: Skin is warm and dry. She is not diaphoretic.  Psychiatric: She has a normal mood and affect. Her behavior is normal.    ED Course  Procedures (including critical care time) [place], ED    Imaging Review Dg Chest 2 View  03/05/2016  CLINICAL DATA:  Acute chest pain. EXAM: CHEST  2 VIEW COMPARISON:  September 29, 2005. FINDINGS: The heart size and mediastinal contours are within normal limits. Both lungs are clear. No pneumothorax or pleural effusion is noted. The visualized skeletal structures are unremarkable. IMPRESSION: No active cardiopulmonary disease. Electronically Signed   By: Marijo Conception, M.D.   On: 03/05/2016 14:52   I have personally reviewed and evaluated these images and lab results as part of my medical decision-making.   EKG Interpretation   Date/Time:  Wednesday March 05 2016 13:49:30 EDT Ventricular Rate:  58 PR Interval:  196 QRS Duration: 68 QT Interval:  404 QTC Calculation: 396 R Axis:   40 Text Interpretation:  Sinus bradycardia Low voltage QRS Septal infarct ,  age undetermined Artifact Abnormal ECG Confirmed by Carmin Muskrat  MD  [phone]) on 03/05/2016 3:28:07 PM      MDM   Final diagnoses:  Atypical chest pain   50 year old female with atypical chest pain  Patient is to be discharged with recommendation to follow up with PCP and cardiology in regards to today's hospital visit. Chest pain is not likely of cardiac or pulmonary etiology d/t presentation, perc negative, VSS, no tracheal  deviation, no JVD or new murmur, RRR, breath sounds equal bilaterally, EKG without acute abnormalities, negative troponin, and negative CXR. Pt has been advised to return to the ED if CP becomes exertional, associated with diaphoresis or nausea, radiates to left jaw/arm, worsens or becomes concerning in any way. Patient states she does not currently have a PCP but she will get one and follow-up regarding today's chest pain. Pt appears reliable for follow up and is agreeable to discharge.   Case has been discussed with Dr. Vanita Panda who agrees with the above plan to discharge.   I discussed all of the results with the patient and family members they have expressed their understanding to the verbal discharge instructions.      Kalman Drape, PA 03/05/16 1540  Carmin Muskrat, MD 03/07/16 2137

## 2016-03-05 NOTE — Discharge Instructions (Signed)
Follow-up with your primary care provider regarding your visit to the ER today.  Call negative appointment with [facility].  Return to the emergency department if you experience worsening chest pain, shortness of breath, dizziness, you passed out, nausea, vomiting.  Chest Pain Observation It is often hard to give a specific diagnosis for the cause of chest pain. Among other possibilities your symptoms might be caused by inadequate oxygen delivery to your heart (angina). Angina that is not treated or evaluated can lead to a heart attack (myocardial infarction) or death. Blood tests, electrocardiograms, and X-rays may have been done to help determine a possible cause of your chest pain. After evaluation and observation, your health care provider has determined that it is unlikely your pain was caused by an unstable condition that requires hospitalization. However, a full evaluation of your pain may need to be completed, with additional diagnostic testing as directed. It is very important to keep your follow-up appointments. Not keeping your follow-up appointments could result in permanent heart damage, disability, or death. If there is any problem keeping your follow-up appointments, you must call your health care provider. HOME CARE INSTRUCTIONS  Due to the slight chance that your pain could be angina, it is important to follow your health care provider's treatment plan and also maintain a healthy lifestyle:  Maintain or work toward achieving a healthy weight.  Stay physically active and exercise regularly.  Decrease your salt intake.  Eat a balanced, healthy diet. Talk to a dietitian to learn about heart-healthy foods.  Increase your fiber intake by including whole grains, vegetables, fruits, and nuts in your diet.  Avoid situations that cause stress, anger, or depression.  Take medicines as advised by your health care provider. Report any side effects to your health care provider. Do  not stop medicines or adjust the dosages on your own.  Quit smoking. Do not use nicotine patches or gum until you check with your health care provider.  Keep your blood pressure, blood sugar, and cholesterol levels within normal limits.  Limit alcohol intake to no more than 1 drink per day for women who are not pregnant and 2 drinks per day for men.  Do not abuse drugs. SEEK IMMEDIATE MEDICAL CARE IF: You have severe chest pain or pressure which may include symptoms such as:  You feel pain or pressure in your arms, neck, jaw, or back.  You have severe back or abdominal pain, feel sick to your stomach (nauseous), or throw up (vomit).  You are sweating profusely.  You are having a fast or irregular heartbeat.  You feel short of breath while at rest.  You notice increasing shortness of breath during rest, sleep, or with activity.  You have chest pain that does not get better after rest or after taking your usual medicine.  You wake from sleep with chest pain.  You are unable to sleep because you cannot breathe.  You develop a frequent cough or you are coughing up blood.  You feel dizzy, faint, or experience extreme fatigue.  You develop severe weakness, dizziness, fainting, or chills. Any of these symptoms may represent a serious problem that is an emergency. Do not wait to see if the symptoms will go away. Call your local emergency services (911 in the [place] Do not drive yourself to the hospital. MAKE SURE YOU:  Understand these instructions.  Will watch your condition.  Will get help right away if you are not doing well or get worse.   This  information is not intended to replace advice given to you by your health care provider. Make sure you discuss any questions you have with your health care provider.   Document Released: 11/29/2010 Document Revised: 11/01/2013 Document Reviewed: 04/28/2013 Elsevier Interactive Patient Education [employer].

## 2016-03-05 NOTE — ED Notes (Signed)
Pt. BIB [facility] for evaluation of mid CP with radiation to L jaw. Denies pain at present. Pt. Went to PCP for eval and was sent here for inverted T waves. Pt. Given 324 ASA and 400 ml NS.

## 2016-03-05 NOTE — Patient Instructions (Signed)
     IF you received an x-ray today, you will receive an invoice from [facility]. Please contact [facility] at [phone] with questions or concerns regarding your invoice.   IF you received labwork today, you will receive an invoice from [facility]. Please contact [facility] at [phone] with questions or concerns regarding your invoice.   Our billing staff will not be able to assist you with questions regarding bills from these companies.  You will be contacted with the lab results as soon as they are available. The fastest way to get your results is to activate your My Chart account. Instructions are located on the last page of this paperwork. If you have not heard from us regarding the results in 2 weeks, please contact this office.      

## 2016-04-24 ENCOUNTER — Other Ambulatory Visit: Payer: Self-pay | Admitting: Gynecology

## 2016-04-24 DIAGNOSIS — Z139 Encounter for screening, unspecified: Secondary | ICD-10-CM

## 2016-06-18 ENCOUNTER — Telehealth: Payer: Self-pay | Admitting: *Deleted

## 2016-06-18 ENCOUNTER — Encounter: Payer: Self-pay | Admitting: Gynecology

## 2016-06-18 ENCOUNTER — Ambulatory Visit (INDEPENDENT_AMBULATORY_CARE_PROVIDER_SITE_OTHER): Payer: Commercial Managed Care - PPO | Admitting: Gynecology

## 2016-06-18 VITALS — BP 120/76 | Ht 66.0 in | Wt 167.0 lb

## 2016-06-18 DIAGNOSIS — Z803 Family history of malignant neoplasm of breast: Secondary | ICD-10-CM

## 2016-06-18 DIAGNOSIS — Z13220 Encounter for screening for lipoid disorders: Secondary | ICD-10-CM | POA: Diagnosis not present

## 2016-06-18 DIAGNOSIS — R922 Inconclusive mammogram: Secondary | ICD-10-CM

## 2016-06-18 DIAGNOSIS — Z01419 Encounter for gynecological examination (general) (routine) without abnormal findings: Secondary | ICD-10-CM | POA: Diagnosis not present

## 2016-06-18 DIAGNOSIS — R9230 Dense breasts, unspecified: Secondary | ICD-10-CM

## 2016-06-18 LAB — CBC WITH DIFFERENTIAL/PLATELET
BASOS PCT: 1 %
Basophils Absolute: 48 cells/uL (ref 0–200)
EOS ABS: 96 {cells}/uL (ref 15–500)
Eosinophils Relative: 2 %
HEMATOCRIT: 39.2 % (ref 35.0–45.0)
Hemoglobin: 12.7 g/dL (ref 11.7–15.5)
Lymphocytes Relative: 29 %
Lymphs Abs: 1392 cells/uL (ref 850–3900)
MCH: 28.2 pg (ref 27.0–33.0)
MCHC: 32.4 g/dL (ref 32.0–36.0)
MCV: 87.1 fL (ref 80.0–100.0)
MONO ABS: 624 {cells}/uL (ref 200–950)
MPV: 11.7 fL (ref 7.5–12.5)
Monocytes Relative: 13 %
NEUTROS ABS: 2640 {cells}/uL (ref 1500–7800)
Neutrophils Relative %: 55 %
PLATELETS: 263 10*3/uL (ref 140–400)
RBC: 4.5 MIL/uL (ref 3.80–5.10)
RDW: 14.3 % (ref 11.0–15.0)
WBC: 4.8 10*3/uL (ref 3.8–10.8)

## 2016-06-18 NOTE — Telephone Encounter (Signed)
I explained to pt that she will need to have a recent mammogram screening before MRI can be scheduled. Pt is schedule 06/20/16, I gave pt the number to  imaging to call them to schedule MRI as they prefer to speak with the patient to schedule to ask family history questions. Pt verbalized she understood , I will place.

## 2016-06-18 NOTE — Patient Instructions (Signed)
Office will call to arrange for the breast MRI. Call my office if you do not hear within the next 2 weeks  You may obtain a copy of any labs that were done today by logging onto MyChart as outlined in the instructions provided with your AVS (after visit summary). The office will not call with normal lab results but certainly if there are any significant abnormalities then we will contact you.   Health Maintenance Adopting a healthy lifestyle and getting preventive care can go a long way to promote health and wellness. Talk with your health care provider about what schedule of regular examinations is right for you. This is a good chance for you to check in with your provider about disease prevention and staying healthy. In between checkups, there are plenty of things you can do on your own. Experts have done a lot of research about which lifestyle changes and preventive measures are most likely to keep you healthy. Ask your health care provider for more information. WEIGHT AND DIET  Eat a healthy diet  Be sure to include plenty of vegetables, fruits, low-fat dairy products, and lean protein.  Do not eat a lot of foods high in solid fats, added sugars, or salt.  Get regular exercise. This is one of the most important things you can do for your health.  Most adults should exercise for at least 150 minutes each week. The exercise should increase your heart rate and make you sweat (moderate-intensity exercise).  Most adults should also do strengthening exercises at least twice a week. This is in addition to the moderate-intensity exercise.  Maintain a healthy weight  Body mass index (BMI) is a measurement that can be used to identify possible weight problems. It estimates body fat based on height and weight. Your health care provider can help determine your BMI and help you achieve or maintain a healthy weight.  For females 52 years of age and older:   A BMI below 18.5 is considered  underweight.  A BMI of 18.5 to 24.9 is normal.  A BMI of 25 to 29.9 is considered overweight.  A BMI of 30 and above is considered obese.  Watch levels of cholesterol and blood lipids  You should start having your blood tested for lipids and cholesterol at 48 years of age, then have this test every 5 years.  You may need to have your cholesterol levels checked more often if:  Your lipid or cholesterol levels are high.  You are older than 48 years of age.  You are at high risk for heart disease.  CANCER SCREENING   Lung Cancer  Lung cancer screening is recommended for adults 82-56 years old who are at high risk for lung cancer because of a history of smoking.  A yearly low-dose CT scan of the lungs is recommended for people who:  Currently smoke.  Have quit within the past 15 years.  Have at least a 30-pack-year history of smoking. A pack year is smoking an average of one pack of cigarettes a day for 1 year.  Yearly screening should continue until it has been 15 years since you quit.  Yearly screening should stop if you develop a health problem that would prevent you from having lung cancer treatment.  Breast Cancer  Practice breast self-awareness. This means understanding how your breasts normally appear and feel.  It also means doing regular breast self-exams. Let your health care provider know about any changes, no matter how small.  If you are in your 20s or 30s, you should have a clinical breast exam (CBE) by a health care provider every 1-3 years as part of a regular health exam.  If you are 44 or older, have a CBE every year. Also consider having a breast X-ray (mammogram) every year.  If you have a family history of breast cancer, talk to your health care provider about genetic screening.  If you are at high risk for breast cancer, talk to your health care provider about having an MRI and a mammogram every year.  Breast cancer gene (BRCA) assessment is  recommended for women who have family members with BRCA-related cancers. BRCA-related cancers include:  Breast.  Ovarian.  Tubal.  Peritoneal cancers.  Results of the assessment will determine the need for genetic counseling and BRCA1 and BRCA2 testing. Cervical Cancer Routine pelvic examinations to screen for cervical cancer are no longer recommended for nonpregnant women who are considered low risk for cancer of the pelvic organs (ovaries, uterus, and vagina) and who do not have symptoms. A pelvic examination may be necessary if you have symptoms including those associated with pelvic infections. Ask your health care provider if a screening pelvic exam is right for you.   The Pap test is the screening test for cervical cancer for women who are considered at risk.  If you had a hysterectomy for a problem that was not cancer or a condition that could lead to cancer, then you no longer need Pap tests.  If you are older than 65 years, and you have had normal Pap tests for the past 10 years, you no longer need to have Pap tests.  If you have had past treatment for cervical cancer or a condition that could lead to cancer, you need Pap tests and screening for cancer for at least 20 years after your treatment.  If you no longer get a Pap test, assess your risk factors if they change (such as having a new sexual partner). This can affect whether you should start being screened again.  Some women have medical problems that increase their chance of getting cervical cancer. If this is the case for you, your health care provider may recommend more frequent screening and Pap tests.  The human papillomavirus (HPV) test is another test that may be used for cervical cancer screening. The HPV test looks for the virus that can cause cell changes in the cervix. The cells collected during the Pap test can be tested for HPV.  The HPV test can be used to screen women 64 years of age and older. Getting tested  for HPV can extend the interval between normal Pap tests from three to five years.  An HPV test also should be used to screen women of any age who have unclear Pap test results.  After 48 years of age, women should have HPV testing as often as Pap tests.  Colorectal Cancer  This type of cancer can be detected and often prevented.  Routine colorectal cancer screening usually begins at 48 years of age and continues through 48 years of age.  Your health care provider may recommend screening at an earlier age if you have risk factors for colon cancer.  Your health care provider may also recommend using home test kits to check for hidden blood in the stool.  A small camera at the end of a tube can be used to examine your colon directly (sigmoidoscopy or colonoscopy). This is done to check for  the earliest forms of colorectal cancer.  Routine screening usually begins at age 68.  Direct examination of the colon should be repeated every 5-10 years through 48 years of age. However, you may need to be screened more often if early forms of precancerous polyps or small growths are found. Skin Cancer  Check your skin from head to toe regularly.  Tell your health care provider about any new moles or changes in moles, especially if there is a change in a mole's shape or color.  Also tell your health care provider if you have a mole that is larger than the size of a pencil eraser.  Always use sunscreen. Apply sunscreen liberally and repeatedly throughout the day.  Protect yourself by wearing long sleeves, pants, a wide-brimmed hat, and sunglasses whenever you are outside. HEART DISEASE, DIABETES, AND HIGH BLOOD PRESSURE   Have your blood pressure checked at least every 1-2 years. High blood pressure causes heart disease and increases the risk of stroke.  If you are between 52 years and 26 years old, ask your health care provider if you should take aspirin to prevent strokes.  Have regular  diabetes screenings. This involves taking a blood sample to check your fasting blood sugar level.  If you are at a normal weight and have a low risk for diabetes, have this test once every three years after 48 years of age.  If you are overweight and have a high risk for diabetes, consider being tested at a younger age or more often. PREVENTING INFECTION  Hepatitis B  If you have a higher risk for hepatitis B, you should be screened for this virus. You are considered at high risk for hepatitis B if:  You were born in a country where hepatitis B is common. Ask your health care provider which countries are considered high risk.  Your parents were born in a high-risk country, and you have not been immunized against hepatitis B (hepatitis B vaccine).  You have HIV or AIDS.  You use needles to inject street drugs.  You live with someone who has hepatitis B.  You have had sex with someone who has hepatitis B.  You get hemodialysis treatment.  You take certain medicines for conditions, including cancer, organ transplantation, and autoimmune conditions. Hepatitis C  Blood testing is recommended for:  Everyone born from [birth year] through 1963.  Anyone with known risk factors for hepatitis C. Sexually transmitted infections (STIs)  You should be screened for sexually transmitted infections (STIs) including gonorrhea and chlamydia if:  You are sexually active and are younger than 48 years of age.  You are older than 48 years of age and your health care provider tells you that you are at risk for this type of infection.  Your sexual activity has changed since you were last screened and you are at an increased risk for chlamydia or gonorrhea. Ask your health care provider if you are at risk.  If you do not have HIV, but are at risk, it may be recommended that you take a prescription medicine daily to prevent HIV infection. This is called pre-exposure prophylaxis (PrEP). You are considered at  risk if:  You are sexually active and do not regularly use condoms or know the HIV status of your partner(s).  You take drugs by injection.  You are sexually active with a partner who has HIV. Talk with your health care provider about whether you are at high risk of being infected with HIV. If you  choose to begin PrEP, you should first be tested for HIV. You should then be tested every 3 months for as long as you are taking PrEP.  PREGNANCY   If you are premenopausal and you may become pregnant, ask your health care provider about preconception counseling.  If you may become pregnant, take 400 to 800 micrograms (mcg) of folic acid every day.  If you want to prevent pregnancy, talk to your health care provider about birth control (contraception). OSTEOPOROSIS AND MENOPAUSE   Osteoporosis is a disease in which the bones lose minerals and strength with aging. This can result in serious bone fractures. Your risk for osteoporosis can be identified using a bone density scan.  If you are 9 years of age or older, or if you are at risk for osteoporosis and fractures, ask your health care provider if you should be screened.  Ask your health care provider whether you should take a calcium or vitamin D supplement to lower your risk for osteoporosis.  Menopause may have certain physical symptoms and risks.  Hormone replacement therapy may reduce some of these symptoms and risks. Talk to your health care provider about whether hormone replacement therapy is right for you.  HOME CARE INSTRUCTIONS   Schedule regular health, dental, and eye exams.  Stay current with your immunizations.   Do not use any tobacco products including cigarettes, chewing tobacco, or electronic cigarettes.  If you are pregnant, do not drink alcohol.  If you are breastfeeding, limit how much and how often you drink alcohol.  Limit alcohol intake to no more than 1 drink per day for nonpregnant women. One drink equals  12 ounces of beer, 5 ounces of wine, or 1 ounces of hard liquor.  Do not use street drugs.  Do not share needles.  Ask your health care provider for help if you need support or information about quitting drugs.  Tell your health care provider if you often feel depressed.  Tell your health care provider if you have ever been abused or do not feel safe at home. Document Released: 05/12/2011 Document Revised: 03/13/2014 Document Reviewed: 09/28/2013 [facility] Patient Information 2015 [place], [place]. This information is not intended to replace advice given to you by your health care provider. Make sure you discuss any questions you have with your health care provider.

## 2016-06-18 NOTE — Progress Notes (Signed)
    [place] [birth year] [id number]        50 y.o.  G2P2002  for annual exam. Several issues noted below.  Past medical history,surgical history, problem list, medications, allergies, family history and social history were all reviewed and documented as reviewed in the EPIC chart.  ROS:  Performed with pertinent positives and negatives included in the history, assessment and plan.   Additional significant findings :  None   Exam: Caryn Bee assistant Vitals:   06/18/16 1100  BP: 120/76  Weight: 167 lb (75.8 kg)  Height: 5\' 6"  (1.676 m)   Body mass index is 26.95 kg/m.  General appearance:  Normal affect, orientation and appearance. Skin: Grossly normal HEENT: Without gross lesions.  No cervical or supraclavicular adenopathy. Thyroid normal.  Lungs:  Clear without wheezing, rales or rhonchi Cardiac: RR, without RMG Abdominal:  Soft, nontender, without masses, guarding, rebound, organomegaly or hernia Breasts:  Examined lying and sitting without masses, retractions, discharge or axillary adenopathy. Pelvic:  Ext/BUS/Vagina normal  Cervix normal  Uterus anteverted, normal size, shape and contour, midline and mobile nontender   Adnexa without masses or tenderness    Anus and perineum normal   Rectovaginal normal sphincter tone without palpated masses or tenderness.    Assessment/Plan:  50 y.o. G34P2002 female for annual exam with irregular menses, condom contraception.   1. Irregular menses. Patient is having some skips in her menses. They are regular when they occur. No prolonged or atypical bleeding. Also having some hot flushes at night. [place] elevated last year. Will keep menstrual calendar and as long as less frequent but regular menses when they occur she'll monitor. Prolonged or atypical bleeding or she goes more than 1 year without bleeding and then bleed she knows to follow up with me. Will try OTC products for the night sweats. Possible HRT reviewed with her. Will  follow up if continues or worsens. 2. History of breast cysts. Exam today does not show any masses but her breasts are dense. Was to do MRI last year as recommended by radiology but did not follow up for this. Asked if I would scheduled this for her and we will go ahead and do this. Has mammogram scheduled next week. Strong family history of breast cancer. Reports mother was genetically tested and negative. Reoffered option for genetic counseling and patient declined. SBE monthly reviewed. 3. Pap smear/HPV 2013. No Pap smear done today. No history of abnormal Pap smears previously. Plan repeat Pap smear next year at 5 year interval per current screening guidelines. 4. Health maintenance. Patient requests routine lab work. CBC, CMP, lipid profile, urinalysis ordered. Follow up 1 year, sooner as needed.   Anastasio Auerbach MD, 11:32 AM 06/18/2016

## 2016-06-18 NOTE — Telephone Encounter (Signed)
-----   Message from Anastasio Auerbach, MD sent at 06/18/2016 11:31 AM EDT ----- Schedule breast MRI reference strong family history of breast cancer, dense breasts.

## 2016-06-19 LAB — LIPID PANEL
CHOL/HDL RATIO: 2.2 ratio (ref <=5.0)
CHOLESTEROL: 219 mg/dL — AB (ref 125–200)
HDL: 98 mg/dL (ref >=46)
LDL Cholesterol: 106 mg/dL (ref <130)
Triglycerides: 76 mg/dL (ref <150)
VLDL: 15 mg/dL (ref <30)

## 2016-06-19 LAB — URINALYSIS W MICROSCOPIC + REFLEX CULTURE
BACTERIA UA: NONE SEEN [HPF]
BILIRUBIN URINE: NEGATIVE
CRYSTALS: NONE SEEN [HPF]
Casts: NONE SEEN [LPF]
GLUCOSE, UA: NEGATIVE
Hgb urine dipstick: NEGATIVE
KETONES UR: NEGATIVE
Leukocytes, UA: NEGATIVE
NITRITE: NEGATIVE
Protein, ur: NEGATIVE
RBC / HPF: NONE SEEN RBC/HPF (ref <=2)
SPECIFIC GRAVITY, URINE: 1.008 (ref 1.001–1.035)
Squamous Epithelial / LPF: NONE SEEN [HPF] (ref <=5)
WBC UA: NONE SEEN WBC/HPF (ref <=5)
Yeast: NONE SEEN [HPF]
pH: 7 (ref 5.0–8.0)

## 2016-06-19 LAB — COMPREHENSIVE METABOLIC PANEL
ALK PHOS: 53 U/L (ref 33–115)
ALT: 11 U/L (ref 6–29)
AST: 18 U/L (ref 10–35)
Albumin: 4.5 g/dL (ref 3.6–5.1)
BILIRUBIN TOTAL: 0.6 mg/dL (ref 0.2–1.2)
BUN: 15 mg/dL (ref 7–25)
CO2: 26 mmol/L (ref 20–31)
Calcium: 9.3 mg/dL (ref 8.6–10.2)
Chloride: 101 mmol/L (ref 98–110)
Creat: 0.75 mg/dL (ref 0.50–1.10)
Glucose, Bld: 89 mg/dL (ref 65–99)
POTASSIUM: 3.9 mmol/L (ref 3.5–5.3)
Sodium: 138 mmol/L (ref 135–146)
TOTAL PROTEIN: 6.9 g/dL (ref 6.1–8.1)

## 2016-06-20 ENCOUNTER — Ambulatory Visit
Admission: RE | Admit: 2016-06-20 | Discharge: 2016-06-20 | Disposition: A | Discharge status: Home / Self Care | Payer: Commercial Managed Care - PPO | Source: Ambulatory Visit | Attending: Gynecology | Admitting: Gynecology

## 2016-06-20 DIAGNOSIS — Z139 Encounter for screening, unspecified: Secondary | ICD-10-CM

## 2016-06-24 NOTE — Telephone Encounter (Signed)
MRI on 07/04/16 at [facility].

## 2016-06-26 ENCOUNTER — Telehealth: Payer: Self-pay

## 2016-06-26 NOTE — Telephone Encounter (Signed)
[facility] called to let me know that patient's MRI [id number] has been approved. Valid dates 07/04/16 through 08/02/16. The authorization # [id number].  Info added to appt notes for MRI.

## 2016-07-04 ENCOUNTER — Other Ambulatory Visit: Payer: Commercial Managed Care - PPO

## 2016-07-16 ENCOUNTER — Inpatient Hospital Stay: Admission: RE | Admit: 2016-07-16 | Payer: Commercial Managed Care - PPO | Source: Ambulatory Visit

## 2016-11-04 ENCOUNTER — Other Ambulatory Visit: Payer: Commercial Managed Care - PPO

## 2016-11-19 ENCOUNTER — Telehealth: Payer: Self-pay

## 2016-11-19 NOTE — Telephone Encounter (Signed)
At 3:30pm on 10/31/16  Radiology center called to tell me that patient is scheduled for a MRI on 11/04/16 and it requires prior authorization and asked if I could get it for them this afternoon.  I looked at her appointments and it was prior authorized by me when originally scheduled on 06/26/2016. The patient had rescheduled it several times and the prior auth validity ended on 08/02/16.  I explained to tech that I am working alone in triage this afternoon and cannot handle prior autho today. She will let patient know and reschedule her. I will keep checking for date scheduled so that we can be sure she has her authorization current this time.  As of today she has not rescheduled.

## 2017-02-24 ENCOUNTER — Ambulatory Visit (INDEPENDENT_AMBULATORY_CARE_PROVIDER_SITE_OTHER): Payer: BC Managed Care – PPO | Admitting: Gynecology

## 2017-02-24 ENCOUNTER — Encounter: Payer: Self-pay | Admitting: Gynecology

## 2017-02-24 VITALS — BP 120/76

## 2017-02-24 DIAGNOSIS — N6002 Solitary cyst of left breast: Secondary | ICD-10-CM | POA: Diagnosis not present

## 2017-02-24 NOTE — Progress Notes (Signed)
    [place] 1966/06/19 [id number]        51 y.o.  G2P2002 presents having noticed a left breast mass of the last several days. It appears to be getting smaller since she first noticed it. She has a history of multiple breast cysts on both sides in the past. Has very dense breasts. Is in the process of arranging an MRI. Last mammogram August 2017 normal.  Past medical history,surgical history, problem list, medications, allergies, family history and social history were all reviewed and documented in the EPIC chart.  Directed ROS with pertinent positives and negatives documented in the history of present illness/assessment and plan.  Exam: Caryn Bee assistant Vitals:   02/24/17 1529  BP: 120/76   General appearance:  Normal Both breast examined lying and sitting. Right without masses, retractions, discharge, adenopathy. Left with 3-4 cm cystic feeling mass 2 to 3:00 position periphery of the breast. No overlying skin changes. No other masses. No axillary adenopathy or nipple discharge. Physical Exam  Pulmonary/Chest:     Procedure: The skin overlying the cyst was cleansed with alcohol, infiltrated with 1% lidocaine and the breast cyst was aspirated with 13 cc of clear yellow fluid removed. The palpable area was completely gone with no residual. Band-Aid applied afterwards. Patient tolerated well.  Assessment/Plan:  51 y.o. H1T0569 with breast cyst in left breast aspirated as above. Patient will continue to set up her MRI and follow up for her mammography when due in August. She'll represent if she palpates any other new areas.    Anastasio Auerbach MD, 4:01 PM 02/24/2017

## 2017-02-24 NOTE — Patient Instructions (Signed)
Follow up if you feel any persistent breast masses.

## 2017-06-12 ENCOUNTER — Telehealth: Payer: Self-pay | Admitting: *Deleted

## 2017-06-12 DIAGNOSIS — R922 Inconclusive mammogram: Secondary | ICD-10-CM

## 2017-06-12 DIAGNOSIS — Z803 Family history of malignant neoplasm of breast: Secondary | ICD-10-CM

## 2017-06-12 NOTE — Telephone Encounter (Signed)
Pt called back to schedule MRI breast that she never rescheduled in Aug 2017. Pt last mammogram was 06/20/16. Per [facility] pt will need to have a recent mammogram before MRI can be scheduled. Pt verbalized she understood and will schedule mammogram she will call me and let me know date of mammogram then we can proceed with scheduling MRI.

## 2017-06-15 ENCOUNTER — Other Ambulatory Visit: Payer: Self-pay | Admitting: Gynecology

## 2017-06-15 DIAGNOSIS — Z1231 Encounter for screening mammogram for malignant neoplasm of breast: Secondary | ICD-10-CM

## 2017-06-19 ENCOUNTER — Telehealth: Payer: Self-pay

## 2017-06-19 NOTE — Telephone Encounter (Signed)
Pt has mammogram scheduled on 06/23/17, order for MRI will be placed and pt will call to schedule [phone]. TD:[id number] history of breast cancer, dense breasts.

## 2017-06-19 NOTE — Telephone Encounter (Signed)
I spoke with Amy M. At [facility] and prior authorized MRI of breast [phone]. Authorization #[id number] valid 06/19/17 through 07/18/17.

## 2017-06-19 NOTE — Telephone Encounter (Signed)
Pt scheduled on 07/01/17 @ 5:20pm at [facility]

## 2017-06-23 ENCOUNTER — Ambulatory Visit
Admission: RE | Admit: 2017-06-23 | Discharge: 2017-06-23 | Disposition: A | Discharge status: Home / Self Care | Payer: BC Managed Care – PPO | Source: Ambulatory Visit | Attending: Gynecology | Admitting: Gynecology

## 2017-06-23 DIAGNOSIS — Z1231 Encounter for screening mammogram for malignant neoplasm of breast: Secondary | ICD-10-CM

## 2017-07-01 ENCOUNTER — Ambulatory Visit
Admission: RE | Admit: 2017-07-01 | Discharge: 2017-07-01 | Disposition: A | Discharge status: Home / Self Care | Payer: BC Managed Care – PPO | Source: Ambulatory Visit | Attending: Gynecology | Admitting: Gynecology

## 2017-07-01 DIAGNOSIS — R922 Inconclusive mammogram: Secondary | ICD-10-CM

## 2017-07-01 DIAGNOSIS — Z803 Family history of malignant neoplasm of breast: Secondary | ICD-10-CM

## 2017-07-01 MED ORDER — GADOBENATE DIMEGLUMINE 529 MG/ML IV SOLN
20.0000 mL | Freq: Once | INTRAVENOUS | Status: AC | PRN
  Administered 2017-07-01: 16 mL via INTRAVENOUS

## 2018-02-04 ENCOUNTER — Encounter: Payer: BC Managed Care – PPO | Admitting: Gynecology

## 2018-02-11 ENCOUNTER — Ambulatory Visit (INDEPENDENT_AMBULATORY_CARE_PROVIDER_SITE_OTHER): Payer: BC Managed Care – PPO | Admitting: Gynecology

## 2018-02-11 ENCOUNTER — Encounter: Payer: Self-pay | Admitting: Gynecology

## 2018-02-11 ENCOUNTER — Telehealth: Payer: Self-pay | Admitting: *Deleted

## 2018-02-11 VITALS — BP 116/72 | Ht 66.0 in | Wt 172.0 lb

## 2018-02-11 DIAGNOSIS — Z01411 Encounter for gynecological examination (general) (routine) with abnormal findings: Secondary | ICD-10-CM

## 2018-02-11 DIAGNOSIS — Z7689 Persons encountering health services in other specified circumstances: Secondary | ICD-10-CM

## 2018-02-11 DIAGNOSIS — Z13220 Encounter for screening for lipoid disorders: Secondary | ICD-10-CM

## 2018-02-11 DIAGNOSIS — N951 Menopausal and female climacteric states: Secondary | ICD-10-CM

## 2018-02-11 DIAGNOSIS — Z1151 Encounter for screening for human papillomavirus (HPV): Secondary | ICD-10-CM

## 2018-02-11 DIAGNOSIS — Z803 Family history of malignant neoplasm of breast: Secondary | ICD-10-CM

## 2018-02-11 LAB — COMPREHENSIVE METABOLIC PANEL
AG Ratio: 1.6 (calc) (ref 1.0–2.5)
ALT: 10 U/L (ref 6–29)
AST: 14 U/L (ref 10–35)
Albumin: 4.2 g/dL (ref 3.6–5.1)
Alkaline phosphatase (APISO): 54 U/L (ref 33–130)
BUN: 17 mg/dL (ref 7–25)
CO2: 30 mmol/L (ref 20–32)
CREATININE: 0.72 mg/dL (ref 0.50–1.05)
Calcium: 9 mg/dL (ref 8.6–10.4)
Chloride: 103 mmol/L (ref 98–110)
GLUCOSE: 107 mg/dL — AB (ref 65–99)
Globulin: 2.6 g/dL (calc) (ref 1.9–3.7)
Potassium: 4.3 mmol/L (ref 3.5–5.3)
SODIUM: 137 mmol/L (ref 135–146)
TOTAL PROTEIN: 6.8 g/dL (ref 6.1–8.1)
Total Bilirubin: 0.5 mg/dL (ref 0.2–1.2)

## 2018-02-11 LAB — CBC WITH DIFFERENTIAL/PLATELET
BASOS PCT: 1.4 %
Basophils Absolute: 57 cells/uL (ref 0–200)
EOS ABS: 111 {cells}/uL (ref 15–500)
Eosinophils Relative: 2.7 %
HCT: 38.2 % (ref 35.0–45.0)
HEMOGLOBIN: 13.2 g/dL (ref 11.7–15.5)
Lymphs Abs: 1505 cells/uL (ref 850–3900)
MCH: 29.6 pg (ref 27.0–33.0)
MCHC: 34.6 g/dL (ref 32.0–36.0)
MCV: 85.7 fL (ref 80.0–100.0)
MONOS PCT: 15.7 %
MPV: 12 fL (ref 7.5–12.5)
NEUTROS ABS: 1784 {cells}/uL (ref 1500–7800)
Neutrophils Relative %: 43.5 %
Platelets: 253 10*3/uL (ref 140–400)
RBC: 4.46 10*6/uL (ref 3.80–5.10)
RDW: 12.9 % (ref 11.0–15.0)
Total Lymphocyte: 36.7 %
WBC: 4.1 10*3/uL (ref 3.8–10.8)
WBCMIX: 644 {cells}/uL (ref 200–950)

## 2018-02-11 LAB — LIPID PANEL
CHOL/HDL RATIO: 2.8 (calc) (ref <5.0)
Cholesterol: 222 mg/dL — ABNORMAL HIGH (ref <200)
HDL: 80 mg/dL (ref >50)
LDL Cholesterol (Calc): 126 mg/dL (calc) — ABNORMAL HIGH
NON-HDL CHOLESTEROL (CALC): 142 mg/dL — AB (ref <130)
TRIGLYCERIDES: 69 mg/dL (ref <150)

## 2018-02-11 LAB — THYROID PANEL WITH TSH
FREE THYROXINE INDEX: 2.1 (ref 1.4–3.8)
T3 UPTAKE: 29 % (ref 22–35)
T4, Total: 7.3 ug/dL (ref 5.1–11.9)
TSH: 1.52 m[IU]/L

## 2018-02-11 NOTE — Telephone Encounter (Signed)
Referral placed at [facility] cancer center, they will call pt schedule.

## 2018-02-11 NOTE — Progress Notes (Signed)
[place] Jan 16, 1966 [id number]        52 y.o.  [id number] for annual gynecologic exam.  Has questions about HRT.  Past medical history,surgical history, problem list, medications, allergies, family history and social history were all reviewed and documented as reviewed in the EPIC chart.  ROS:  Performed with pertinent positives and negatives included in the history, assessment and plan.   Additional significant findings : None   Exam: Caryn Bee assistant Vitals:   02/11/18 0840  BP: 116/72  Weight: 172 lb (78 kg)  Height: 5' 6" (1.676 m)   Body mass index is 27.76 kg/m.  General appearance:  Normal affect, orientation and appearance. Skin: Grossly normal HEENT: Without gross lesions.  No cervical or supraclavicular adenopathy. Thyroid normal.  Lungs:  Clear without wheezing, rales or rhonchi Cardiac: RR, without RMG Abdominal:  Soft, nontender, without masses, guarding, rebound, organomegaly or hernia Breasts:  Examined lying and sitting without masses, retractions, discharge or axillary adenopathy. Pelvic:  Ext, BUS, Vagina: Normal  Cervix: Normal.  Pap smear/HPV  Uterus: Anteverted, normal size, shape and contour, midline and mobile nontender   Adnexa: Without masses or tenderness    Anus and perineum: Normal   Rectovaginal: Normal sphincter tone without palpated masses or tenderness.    Assessment/Plan:  52 y.o. G72P2002 female for annual gynecologic exam with irregular menses, condom contraception.   1. Perimenopausal.  Patient's menses have spaced out with LMP November 2018.  Was having some hot flushes and sweats but notes that these seem to have resolved.  History of elevated FSH in the past.  The patient and I discussed the whole issue of HRT.  We reviewed the risks versus benefits and indications to treat.  Benefits to include symptom relief and possible cardiovascular and bone health when started early versus risks to include thrombosis and breast cancer  were reviewed.  At this point she is not having significant symptoms in the issues of whether to start for prophylactic reasons reviewed.  After a lengthy discussion patient is not interested in starting at this point.  She will keep a menstrual calendar as long as less frequent but regular menses will monitor.  If prolonged or atypical bleeding or goes more than 1 year without menses and then bleeds she will call us. 2. History of dense breast with recurrent breast cysts.  MRI recommended by radiology in the past.  Patient had her MRI in August as well as her mammogram.  Breast exam normal today.  We discussed whether to continue with annual MRIs based on her breast density.  She will repeat her mammogram this coming year and then will reassess based on radiology recommendations.  We also discussed her family history of breast cancer which includes her mother, maternal aunt and  maternal grandmother.  She does relate that her mother had been tested for BRCA genes and were negative.  I recommended that the patient schedule an appointment with a genetic counselor to see if further testing such as an expanded panel would be indicated and she agrees to do so.  We will also be able to calculate her lifetime risk of breast cancer to see if ongoing MRIs would be recommended based on this. 3. Pap smear/HPV 2013.  Pap smear/HPV today.  No history of abnormal Pap smears.  Plan repeat Pap smear at 5-year intervals per current screening guidelines. 4. Colonoscopy not yet.  Recommended scheduling screening colonoscopy as she has turned 52.  Names and numbers provided.  5. Weight management.  Patient having difficulty maintaining her weight.  Issues of balancing exercise and caloric intake reviewed.  She is working on this now.  Will check baseline thyroid panel. 6. Health maintenance.  CBC, CMP, lipid profile, thyroid panel ordered.  Follow-up 1 year, sooner as needed.   Anastasio Auerbach MD, 9:11 AM  02/11/2018

## 2018-02-11 NOTE — Patient Instructions (Signed)
Schedule your colonoscopy with either:  [facility]   Address: [address], [place], [place] [address]  [phone]    or  [facility]  Address: [address], [place], [place] [address]  [phone]      

## 2018-02-11 NOTE — Addendum Note (Signed)
Addended by: Nelva Nay on: 02/11/2018 10:16 AM   Modules accepted: Orders

## 2018-02-11 NOTE — Telephone Encounter (Signed)
-----   Message from Anastasio Auerbach, MD sent at 02/11/2018  9:53 AM EDT ----- Genetic counseling appointment at [facility] reference strong family history of breast cancer

## 2018-02-12 ENCOUNTER — Other Ambulatory Visit: Payer: Self-pay | Admitting: Gynecology

## 2018-02-12 DIAGNOSIS — E7800 Pure hypercholesterolemia, unspecified: Secondary | ICD-10-CM

## 2018-02-12 DIAGNOSIS — R7309 Other abnormal glucose: Secondary | ICD-10-CM

## 2018-02-15 LAB — PAP IG AND HPV HIGH-RISK: HPV DNA HIGH RISK: NOT DETECTED

## 2018-02-22 ENCOUNTER — Encounter: Payer: Self-pay | Admitting: *Deleted

## 2018-02-22 NOTE — Telephone Encounter (Signed)
Michelle Love has left message for pt to call.

## 2018-02-23 ENCOUNTER — Encounter: Payer: Self-pay | Admitting: Genetics

## 2018-02-23 NOTE — Telephone Encounter (Signed)
Pt scheduled on 04/06/18

## 2018-04-06 ENCOUNTER — Inpatient Hospital Stay: Payer: BC Managed Care – PPO | Attending: Genetic Counselor | Admitting: Genetics

## 2018-04-06 ENCOUNTER — Inpatient Hospital Stay: Payer: BC Managed Care – PPO

## 2018-09-02 ENCOUNTER — Other Ambulatory Visit: Payer: Self-pay | Admitting: Gynecology

## 2018-09-02 DIAGNOSIS — Z1231 Encounter for screening mammogram for malignant neoplasm of breast: Secondary | ICD-10-CM

## 2018-10-19 ENCOUNTER — Ambulatory Visit
Admission: RE | Admit: 2018-10-19 | Discharge: 2018-10-19 | Disposition: A | Discharge status: Home / Self Care | Payer: BC Managed Care – PPO | Source: Ambulatory Visit | Attending: Gynecology | Admitting: Gynecology

## 2018-10-19 ENCOUNTER — Ambulatory Visit: Payer: BC Managed Care – PPO

## 2018-10-19 DIAGNOSIS — Z1231 Encounter for screening mammogram for malignant neoplasm of breast: Secondary | ICD-10-CM

## 2019-08-03 ENCOUNTER — Encounter: Payer: Self-pay | Admitting: Gynecology

## 2019-08-31 ENCOUNTER — Other Ambulatory Visit: Payer: Self-pay

## 2019-08-31 DIAGNOSIS — Z20822 Contact with and (suspected) exposure to covid-19: Secondary | ICD-10-CM

## 2019-09-01 LAB — NOVEL CORONAVIRUS, NAA: SARS-CoV-2, NAA: NOT DETECTED

## 2019-12-15 ENCOUNTER — Other Ambulatory Visit: Payer: Self-pay | Admitting: Family Medicine

## 2019-12-15 DIAGNOSIS — Z1231 Encounter for screening mammogram for malignant neoplasm of breast: Secondary | ICD-10-CM

## 2020-01-17 ENCOUNTER — Ambulatory Visit
Admission: RE | Admit: 2020-01-17 | Discharge: 2020-01-17 | Disposition: A | Discharge status: Home / Self Care | Payer: BC Managed Care – PPO | Source: Ambulatory Visit | Attending: Family Medicine | Admitting: Family Medicine

## 2020-01-17 ENCOUNTER — Other Ambulatory Visit: Payer: Self-pay

## 2020-01-17 DIAGNOSIS — Z1231 Encounter for screening mammogram for malignant neoplasm of breast: Secondary | ICD-10-CM

## 2020-07-02 ENCOUNTER — Other Ambulatory Visit: Payer: Self-pay | Admitting: Family Medicine

## 2020-07-02 DIAGNOSIS — Z803 Family history of malignant neoplasm of breast: Secondary | ICD-10-CM

## 2020-07-02 DIAGNOSIS — R9230 Dense breasts, unspecified: Secondary | ICD-10-CM

## 2020-07-02 DIAGNOSIS — Z1239 Encounter for other screening for malignant neoplasm of breast: Secondary | ICD-10-CM

## 2020-07-02 DIAGNOSIS — R922 Inconclusive mammogram: Secondary | ICD-10-CM

## 2020-08-21 ENCOUNTER — Other Ambulatory Visit: Payer: BC Managed Care – PPO

## 2020-10-15 ENCOUNTER — Other Ambulatory Visit: Payer: Self-pay

## 2020-12-19 ENCOUNTER — Other Ambulatory Visit: Payer: Self-pay | Admitting: Family Medicine

## 2020-12-19 DIAGNOSIS — Z1231 Encounter for screening mammogram for malignant neoplasm of breast: Secondary | ICD-10-CM

## 2021-02-12 ENCOUNTER — Ambulatory Visit
Admission: RE | Admit: 2021-02-12 | Discharge: 2021-02-12 | Disposition: A | Discharge status: Home / Self Care | Payer: Commercial Managed Care - PPO | Source: Ambulatory Visit | Attending: Family Medicine | Admitting: Family Medicine

## 2021-02-12 ENCOUNTER — Other Ambulatory Visit: Payer: Self-pay

## 2021-02-12 DIAGNOSIS — Z1231 Encounter for screening mammogram for malignant neoplasm of breast: Secondary | ICD-10-CM

## 2021-04-29 ENCOUNTER — Encounter: Payer: Self-pay | Admitting: Internal Medicine

## 2021-06-06 ENCOUNTER — Other Ambulatory Visit: Payer: Self-pay

## 2021-06-06 ENCOUNTER — Ambulatory Visit (AMBULATORY_SURGERY_CENTER): Payer: Commercial Managed Care - PPO | Admitting: *Deleted

## 2021-06-06 VITALS — Ht 66.0 in | Wt 165.0 lb

## 2021-06-06 DIAGNOSIS — Z1211 Encounter for screening for malignant neoplasm of colon: Secondary | ICD-10-CM

## 2021-06-06 NOTE — Progress Notes (Signed)
  Virtual pre visit completed. Instructions sent through [place] and secure email to  [email or web address]   No egg or soy allergy known to patient  No issues with past sedation with any surgeries or procedures Patient denies ever being told they had issues or difficulty with intubation  No FH of Malignant Hyperthermia No diet pills per patient No home 02 use per patient  No blood thinners per patient  Pt denies issues with constipation  No A fib or A flutter  EMMI video to pt or via [place] 19 guidelines implemented in [place] today with Pt and RN   Due to the COVID-19 pandemic we are asking patients to follow certain guidelines.  Pt aware of COVID protocols and [facility] guidelines

## 2021-06-20 ENCOUNTER — Ambulatory Visit (AMBULATORY_SURGERY_CENTER): Payer: Commercial Managed Care - PPO | Admitting: Internal Medicine

## 2021-06-20 ENCOUNTER — Other Ambulatory Visit: Payer: Self-pay

## 2021-06-20 ENCOUNTER — Encounter: Payer: Self-pay | Admitting: Internal Medicine

## 2021-06-20 VITALS — BP 113/62 | HR 72 | Temp 97.0°F | Resp 15 | Ht 66.0 in | Wt 165.0 lb

## 2021-06-20 DIAGNOSIS — D120 Benign neoplasm of cecum: Secondary | ICD-10-CM

## 2021-06-20 DIAGNOSIS — K635 Polyp of colon: Secondary | ICD-10-CM

## 2021-06-20 DIAGNOSIS — Z860100 Personal history of colon polyps, unspecified: Secondary | ICD-10-CM

## 2021-06-20 DIAGNOSIS — Z1211 Encounter for screening for malignant neoplasm of colon: Secondary | ICD-10-CM

## 2021-06-20 HISTORY — DX: Personal history of colonic polyps: Z86.010

## 2021-06-20 HISTORY — DX: Personal history of colon polyps, unspecified: Z86.0100

## 2021-06-20 HISTORY — PX: COLONOSCOPY W/ POLYPECTOMY: SHX1380

## 2021-06-20 MED ORDER — SODIUM CHLORIDE 0.9 % IV SOLN: 500.0000 mL | Freq: Once | INTRAVENOUS | Status: DC

## 2021-06-20 NOTE — Progress Notes (Signed)
A and O x3. Report to RN. Tolerated MAC anesthesia well. 

## 2021-06-20 NOTE — Progress Notes (Signed)
[facility] History and Physical   Primary Care Physician:  Nickola Major, MD   Reason for Procedure:   Colon cancer screening  Plan:    colonoscopy     HPI: Michelle Love is a 55 y.o. female here for screening colonoscopy   Past Medical History:  Diagnosis Date   Allergy    Anxiety    GERD (gastroesophageal reflux disease)    Viral meningitis 11/10/2004    Past Surgical History:  Procedure Laterality Date   BREAST BIOPSY Left 05/29/2014   benign   CESAREAN SECTION      Prior to Admission medications   Medication Sig Start Date End Date Taking? Authorizing Provider  ibuprofen (ADVIL,MOTRIN) 200 MG tablet Take 400 mg by mouth every 6 (six) hours as needed for headache.   Yes [provider]  Polyethylene Glycol 3350 (MIRALAX PO) Take 238 g by mouth once.   Yes [provider]    Current Outpatient Medications  Medication Sig Dispense Refill   ibuprofen (ADVIL,MOTRIN) 200 MG tablet Take 400 mg by mouth every 6 (six) hours as needed for headache.     Polyethylene Glycol 3350 (MIRALAX PO) Take 238 g by mouth once.     Current Facility-Administered Medications  Medication Dose Route Frequency Provider Last Rate Last Admin   0.9 %  sodium chloride infusion  500 mL Intravenous Once Gatha Mayer, MD        Allergies as of 06/20/2021 - Review Complete 06/20/2021  Allergen Reaction Noted   Codeine Nausea And Vomiting and Swelling     Family History  Problem Relation Age of Onset   Colon polyps Mother    Breast cancer Mother 6   Diabetes Father        type 2   Hypertension Father    Diabetes Brother        type 1   Hypertension Brother    Breast cancer Maternal Aunt        thinks it was before menopause   Breast cancer Maternal Grandmother        post menopause   Hypertension Paternal Grandmother    Stroke Paternal Grandmother    Cancer Other        colon   Esophageal cancer Neg Hx    Stomach cancer Neg Hx    Rectal  cancer Neg Hx     Social History   Socioeconomic History   Marital status: Married    Spouse name: Not on file   Number of children: Not on file   Years of education: Not on file   Highest education level: Not on file  Occupational History   Not on file  Tobacco Use   Smoking status: Never   Smokeless tobacco: Never  Vaping Use   Vaping Use: Never used  Substance and Sexual Activity   Alcohol use: Yes    Alcohol/week: 0.0 standard drinks    Comment: twice weekly   Drug use: No   Sexual activity: Yes    Birth control/protection: Condom, Rhythm    Comment: 1st intercourse 55 yo-Fewer than 5 partners  Other Topics Concern   Not on file  Social History Narrative   Not on file   Social Determinants of Health   Financial Resource Strain: Not on file  Food Insecurity: Not on file  Transportation Needs: Not on file  Physical Activity: Not on file  Stress: Not on file  Social Connections: Not on file  Intimate Partner Violence:  Not on file    Review of Systems:  All other review of systems negative except as mentioned in the HPI.  Physical Exam: Vital signs BP 119/76   Pulse 84   Temp (!) 97 F (36.1 C)   Resp 14   Ht '5\' 6"'$  (1.676 m)   Wt 165 lb (74.8 kg)   SpO2 100%   BMI 26.63 kg/m   General:   Alert,  Well-developed, well-nourished, pleasant and cooperative in NAD Lungs:  Clear throughout to auscultation.   Heart:  Regular rate and rhythm; no murmurs, clicks, rubs,  or gallops. Abdomen:  Soft, nontender and nondistended. Normal bowel sounds.   Neuro/Psych:  Alert and cooperative. Normal mood and affect. A and O x 3   '@Tauheed Mcfayden'$  Simonne Maffucci, MD, [facility] [phone] (pager) 06/20/2021 2:46 PM@'

## 2021-06-20 NOTE — Op Note (Signed)
[facility] Patient Name: Michelle Love Procedure Date: 06/20/2021 2:32 PM MRN: [id number] Endoscopist: Gatha Mayer , MD Age: 55 Referring MD:  Date of Birth: July 21, 1966 Gender: Female Account #: [id number] Procedure:                Colonoscopy Indications:              Screening for colorectal malignant neoplasm, This                            is the patient's first colonoscopy Medicines:                Propofol per Anesthesia, Monitored Anesthesia Care Procedure:                Pre-Anesthesia Assessment:                           - Prior to the procedure, a History and Physical                            was performed, and patient medications and                            allergies were reviewed. The patient's tolerance of                            previous anesthesia was also reviewed. The risks                            and benefits of the procedure and the sedation                            options and risks were discussed with the patient.                            All questions were answered, and informed consent                            was obtained. Prior Anticoagulants: The patient has                            taken no previous anticoagulant or antiplatelet                            agents. ASA Grade Assessment: II - A patient with                            mild systemic disease. After reviewing the risks                            and benefits, the patient was deemed in                            satisfactory condition to undergo the procedure.  After obtaining informed consent, the colonoscope                            was passed under direct vision. Throughout the                            procedure, the patient's blood pressure, pulse, and                            oxygen saturations were monitored continuously. The                            Olympus PCF-H190DL [id number]) Colonoscope was                             introduced through the anus and advanced to the the                            cecum, identified by appendiceal orifice and                            ileocecal valve. The colonoscopy was performed                            without difficulty. The patient tolerated the                            procedure well. The quality of the bowel                            preparation was excellent. The ileocecal valve,                            appendiceal orifice, and rectum were photographed.                            The bowel preparation used was Miralax via split                            dose instruction. Scope In: 2:48:25 PM Scope Out: 3:06:07 PM Scope Withdrawal Time: 0 hours 12 minutes 44 seconds  Total Procedure Duration: 0 hours 17 minutes 42 seconds  Findings:                 The perianal and digital rectal examinations were                            normal.                           A 7 mm polyp was found in the cecum. The polyp was                            flat. The polyp was removed with a piecemeal  technique using a cold snare. Resection and                            retrieval were complete. Verification of patient                            identification for the specimen was done. Estimated                            blood loss was minimal.                           Scattered diverticula were found in the entire                            colon.                           The exam was otherwise without abnormality on                            direct and retroflexion views. Complications:            No immediate complications. Estimated Blood Loss:     Estimated blood loss was minimal. Impression:               - One 7 mm polyp in the cecum, removed piecemeal                            using a cold snare. Resected and retrieved.                           - Diverticulosis in the entire examined colon.                           - The examination  was otherwise normal on direct                            and retroflexion views. Recommendation:           - Patient has a contact number available for                            emergencies. The signs and symptoms of potential                            delayed complications were discussed with the                            patient. Return to normal activities tomorrow.                            Written discharge instructions were provided to the                            patient.                           -  Resume previous diet.                           - Continue present medications.                           - Repeat colonoscopy is recommended. The                            colonoscopy date will be determined after pathology                            results from today's exam become available for                            review. Gatha Mayer, MD 06/20/2021 3:13:41 PM This report has been signed electronically.

## 2021-06-20 NOTE — Patient Instructions (Addendum)
I found and removed one small polyp.   I will let you know pathology results and when to have another routine colonoscopy by mail and/or My Chart.  You also have a condition called diverticulosis - common and not usually a problem. Please read the handout provided.  I appreciate the opportunity to care for you. Gatha Mayer, MD, [facility]  Continue present medications.  YOU HAD AN ENDOSCOPIC PROCEDURE TODAY AT [facility]:   Refer to the procedure report that was given to you for any specific questions about what was found during the examination.  If the procedure report does not answer your questions, please call your gastroenterologist to clarify.  If you requested that your care partner not be given the details of your procedure findings, then the procedure report has been included in a sealed envelope for you to review at your convenience later.  YOU SHOULD EXPECT: Some feelings of bloating in the abdomen. Passage of more gas than usual.  Walking can help get rid of the air that was put into your GI tract during the procedure and reduce the bloating. If you had a lower endoscopy (such as a colonoscopy or flexible sigmoidoscopy) you may notice spotting of blood in your stool or on the toilet paper. If you underwent a bowel prep for your procedure, you may not have a normal bowel movement for a few days.  Please Note:  You might notice some irritation and congestion in your nose or some drainage.  This is from the oxygen used during your procedure.  There is no need for concern and it should clear up in a day or so.  SYMPTOMS TO REPORT IMMEDIATELY:  Following lower endoscopy (colonoscopy or flexible sigmoidoscopy):  Excessive amounts of blood in the stool  Significant tenderness or worsening of abdominal pains  Swelling of the abdomen that is new, acute  Fever of 100F or higher    For urgent or emergent issues, a gastroenterologist can be reached at any hour by calling  [phone]. Do not use MyChart messaging for urgent concerns.    DIET:  We do recommend a small meal at first, but then you may proceed to your regular diet.  Drink plenty of fluids but you should avoid alcoholic beverages for 24 hours.  ACTIVITY:  You should plan to take it easy for the rest of today and you should NOT DRIVE or use heavy machinery until tomorrow (because of the sedation medicines used during the test).    FOLLOW UP: Our staff will call the number listed on your records 48-72 hours following your procedure to check on you and address any questions or concerns that you may have regarding the information given to you following your procedure. If we do not reach you, we will leave a message.  We will attempt to reach you two times.  During this call, we will ask if you have developed any symptoms of COVID 19. If you develop any symptoms (ie: fever, flu-like symptoms, shortness of breath, cough etc.) before then, please call [phone].  If you test positive for Covid 19 in the 2 weeks post procedure, please call and report this information to [place].    If any biopsies were taken you will be contacted by phone or by letter within the next 1-3 weeks.  Please call us at [phone] if you have not heard about the biopsies in 3 weeks.    SIGNATURES/CONFIDENTIALITY: You and/or your care partner have signed  paperwork which will be entered into your electronic medical record.  These signatures attest to the fact that that the information above on your After Visit Summary has been reviewed and is understood.  Full responsibility of the confidentiality of this discharge information lies with you and/or your care-partner.

## 2021-06-20 NOTE — Progress Notes (Signed)
Called to room to assist during endoscopic procedure.  Patient ID and intended procedure confirmed with present staff. Received instructions for my participation in the procedure from the performing physician.  

## 2021-06-24 ENCOUNTER — Telehealth: Payer: Self-pay

## 2021-06-24 NOTE — Telephone Encounter (Signed)
  Follow up Call-  Call back number 06/20/2021  Post procedure Call Back phone  # [phone]  Permission to leave phone message Yes  Some recent data might be hidden     Patient questions:  Do you have a fever, pain , or abdominal swelling? No. Pain Score  0 *  Have you tolerated food without any problems? Yes.    Have you been able to return to your normal activities? Yes.    Do you have any questions about your discharge instructions: Diet   No. Medications  No. Follow up visit  No.  Do you have questions or concerns about your Care? No.  Actions: * If pain score is 4 or above: No action needed, pain <4.

## 2021-07-08 ENCOUNTER — Encounter: Payer: Self-pay | Admitting: Internal Medicine

## 2021-09-24 ENCOUNTER — Other Ambulatory Visit: Payer: Self-pay | Admitting: Family Medicine

## 2021-09-24 DIAGNOSIS — R922 Inconclusive mammogram: Secondary | ICD-10-CM

## 2021-09-24 DIAGNOSIS — R9230 Dense breasts, unspecified: Secondary | ICD-10-CM

## 2021-09-24 DIAGNOSIS — Z803 Family history of malignant neoplasm of breast: Secondary | ICD-10-CM

## 2021-10-15 ENCOUNTER — Ambulatory Visit
Admission: RE | Admit: 2021-10-15 | Discharge: 2021-10-15 | Disposition: A | Discharge status: Home / Self Care | Payer: Commercial Managed Care - PPO | Source: Ambulatory Visit | Attending: Family Medicine | Admitting: Family Medicine

## 2021-10-15 DIAGNOSIS — Z803 Family history of malignant neoplasm of breast: Secondary | ICD-10-CM

## 2021-10-15 DIAGNOSIS — R922 Inconclusive mammogram: Secondary | ICD-10-CM

## 2021-10-15 MED ORDER — GADOBUTROL 1 MMOL/ML IV SOLN
7.0000 mL | Freq: Once | INTRAVENOUS | Status: AC | PRN
  Administered 2021-10-15: 7 mL via INTRAVENOUS

## 2022-02-10 NOTE — Progress Notes (Signed)
56 y.o. G20P2002 Married Caucasian female here for annual exam.   ? ?LMP 02/09/20. ? ?Menopausal symptoms have stopped.  ?Having painful intercourse.  ? ?Not a candidate for estrogen due to strong family history of breast cancer.  ?She has not had genetic counseling or testing.  ?States bother tested negative for genetic mutations, but that this was a long time ago. ? ?PCP:   Eyvonne Mechanic, MD ? ?Patient is menopausal.    ?  ?    ?Sexually active: Yes.    ?The current method of family planning is post menopausal status.    ?Exercising: Yes.    Gym/ health club routine includes cardio and light weights. ?Smoker:  no ? ?Health Maintenance: ?Pap:  02-11-18 Neg:Neg HR HPV, 09-29-12 Neg:Neg HR HPV ?History of abnormal Pap:  no ?MMG:  10-15-21 MR Br.Bil/Neg/BiRads2.  Due for mammogram.  ?Colonoscopy:  2022 polyp ?BMD:   Never  Result : ?TDaP:  12-15-19 ?Gardasil:   no ?Hep C:  12-15-19 Neg ?Screening Labs:  PCP ? ? reports that she has never smoked. She has never used smokeless tobacco. She reports current alcohol use. She reports that she does not use drugs. ? ?Past Medical History:  ?Diagnosis Date  ? Allergy   ? Anxiety   ? GERD (gastroesophageal reflux disease)   ? Hx of colonic polyp 06/20/2021  ? 7 mm ssp  ? Viral meningitis 11/10/2004  ? ? ?Past Surgical History:  ?Procedure Laterality Date  ? BREAST BIOPSY Left 05/29/2014  ? benign  ? CESAREAN SECTION    ? COLONOSCOPY W/ POLYPECTOMY  06/20/2021  ? 7 mm ssp  ? ? ?Current Outpatient Medications  ?Medication Sig Dispense Refill  ? famotidine (PEPCID) 10 MG tablet Take 10 mg by mouth 2 (two) times daily.    ? ibuprofen (ADVIL,MOTRIN) 200 MG tablet Take 400 mg by mouth every 6 (six) hours as needed for headache.    ? Polyethylene Glycol 3350 (MIRALAX PO) Take 238 g by mouth once. (Patient not taking: Reported on 02/21/2022)    ? ?No current facility-administered medications for this visit.  ? ? ?Family History  ?Problem Relation Age of Onset  ? Colon polyps Mother   ? Breast  cancer Mother 49  ?     recurrence age 36  ? Diabetes Father   ?     type 2  ? Hypertension Father   ? Diabetes Brother   ?     type 1  ? Hypertension Brother   ? Hyperlipidemia Brother   ? Breast cancer Maternal Aunt   ?     thinks it was before menopause  ? Breast cancer Maternal Grandmother   ?     post menopause  ? Hypertension Paternal Grandmother   ? Stroke Paternal Grandmother   ? Cancer Other   ?     colon  ? Esophageal cancer Neg Hx   ? Stomach cancer Neg Hx   ? Rectal cancer Neg Hx   ? ? ?Review of Systems  ?All other systems reviewed and are negative. ? ?Exam:   ?BP 116/72   Ht 5' 3.75" (1.619 m)   Wt 169 lb (76.7 kg)   LMP 02/09/2020 (Approximate)   BMI 29.24 kg/m?     ?General appearance: alert, cooperative and appears stated age ?Head: normocephalic, without obvious abnormality, atraumatic ?Neck: no adenopathy, supple, symmetrical, trachea midline and thyroid normal to inspection and palpation ?Lungs: clear to auscultation bilaterally ?Breasts: normal appearance, no masses or tenderness,  No nipple retraction or dimpling, No nipple discharge or bleeding, No axillary adenopathy ?Heart: regular rate and rhythm ?Abdomen: soft, non-tender; no masses, no organomegaly ?Extremities: extremities normal, atraumatic, no cyanosis or edema ?Skin: skin color, texture, turgor normal. No rashes or lesions ?Lymph nodes: cervical, supraclavicular, and axillary nodes normal. ?Neurologic: grossly normal ? ?Pelvic: External genitalia:  no lesions ?             No abnormal inguinal nodes palpated. ?             Urethra:  normal appearing urethra with no masses, tenderness or lesions ?             Bartholins and Skenes: normal    ?             Vagina: normal appearing vagina with normal color and discharge, no lesions ?             Cervix: no lesions ?             Pap taken: no ?Bimanual Exam:  Uterus:  normal size, contour, position, consistency, mobility, non-tender ?             Adnexa: no mass, fullness, tenderness ?              Rectal exam: yes.  Confirms. ?             Anus:  normal sphincter tone, no lesions ? ?Chaperone was present for exam:  yes.  ? ?Assessment:   ?Well woman visit with gynecologic exam. ?Cat D density of breast.  ?FH breast cancer.  Mother had negative genetic testing, but is has been a while ago.  ?Vaginal atrophy.  ? ?Plan: ?Mammogram screening discussed. ?Self breast awareness reviewed. ?Pap and HR HPV as above. ?Guidelines for Calcium, Vitamin D, regular exercise program including cardiovascular and weight bearing exercise. ?We had a comprehensive discussion of family history of breast cancer, genetic counseling and testing, and potential strategies for breast cancer risk reduction.  ?Yearly MRI of the breast is an option, which may include an abbreviated MRI.  ?She accepts referral for genetic counseling and possible testing.  ?Try OTC Vit E vaginal suppositories and cooking oil for vaginal atrophy. ?We did review vaginal estrogen use if on concurrent Tamoxifen.  ?Follow up annually and prn.  ? ?After visit summary provided.  ? ? ?  ?

## 2022-02-17 ENCOUNTER — Encounter: Payer: Self-pay | Admitting: Obstetrics and Gynecology

## 2022-02-21 ENCOUNTER — Ambulatory Visit (INDEPENDENT_AMBULATORY_CARE_PROVIDER_SITE_OTHER): Payer: Commercial Managed Care - PPO | Admitting: Obstetrics and Gynecology

## 2022-02-21 ENCOUNTER — Encounter: Payer: Self-pay | Admitting: Obstetrics and Gynecology

## 2022-02-21 VITALS — BP 116/72 | Ht 63.75 in | Wt 169.0 lb

## 2022-02-21 DIAGNOSIS — Z803 Family history of malignant neoplasm of breast: Secondary | ICD-10-CM

## 2022-02-21 DIAGNOSIS — Z01419 Encounter for gynecological examination (general) (routine) without abnormal findings: Secondary | ICD-10-CM | POA: Diagnosis not present

## 2022-02-21 DIAGNOSIS — Z0000 Encounter for general adult medical examination without abnormal findings: Secondary | ICD-10-CM

## 2022-02-21 NOTE — Patient Instructions (Signed)
EXERCISE AND DIET:  We recommended that you start or continue a regular exercise program for good health. Regular exercise means any activity that makes your heart beat faster and makes you sweat.  We recommend exercising at least 30 minutes per day at least 3 days a week, preferably 4 or 5.  We also recommend a diet low in fat and sugar.  Inactivity, poor dietary choices and obesity can cause diabetes, heart attack, stroke, and kidney damage, among others.   ? ?ALCOHOL AND SMOKING:  Women should limit their alcohol intake to no more than 7 drinks/beers/glasses of wine (combined, not each!) per week. Moderation of alcohol intake to this level decreases your risk of breast cancer and liver damage. And of course, no recreational drugs are part of a healthy lifestyle.  And absolutely no smoking or even second hand smoke. Most people know smoking can cause heart and lung diseases, but did you know it also contributes to weakening of your bones? Aging of your skin?  Yellowing of your teeth and nails? ? ?CALCIUM AND VITAMIN D:  Adequate intake of calcium and Vitamin D are recommended.  The recommendations for exact amounts of these supplements seem to change often, but generally speaking 600 mg of calcium (either carbonate or citrate) and 800 units of Vitamin D per day seems prudent. Certain women may benefit from higher intake of Vitamin D.  If you are among these women, your doctor will have told you during your visit.   ? ?PAP SMEARS:  Pap smears, to check for cervical cancer or precancers,  have traditionally been done yearly, although recent scientific advances have shown that most women can have pap smears less often.  However, every woman still should have a physical exam from her gynecologist every year. It will include a breast check, inspection of the vulva and vagina to check for abnormal growths or skin changes, a visual exam of the cervix, and then an exam to evaluate the size and shape of the uterus and  ovaries.  And after 54 years of age, a rectal exam is indicated to check for rectal cancers. We will also provide age appropriate advice regarding health maintenance, like when you should have certain vaccines, screening for sexually transmitted diseases, bone density testing, colonoscopy, mammograms, etc.  ? ?MAMMOGRAMS:  All women over 45 years old should have a yearly mammogram. Many facilities now offer a "3D" mammogram, which may cost around $50 extra out of pocket. If possible,  we recommend you accept the option to have the 3D mammogram performed.  It both reduces the number of women who will be called back for extra views which then turn out to be normal, and it is better than the routine mammogram at detecting truly abnormal areas.   ? ?COLONOSCOPY:  Colonoscopy to screen for colon cancer is recommended for all women at age 18.  We know, you hate the idea of the prep.  We agree, BUT, having colon cancer and not knowing it is worse!!  Colon cancer so often starts as a polyp that can be seen and removed at colonscopy, which can quite literally save your life!  And if your first colonoscopy is normal and you have no family history of colon cancer, most women don't have to have it again for 10 years.  Once every ten years, you can do something that may end up saving your life, right?  We will be happy to help you get it scheduled when you are ready.  Be sure to check your insurance coverage so you understand how much it will cost.  It may be covered as a preventative service at no cost, but you should check your particular policy.    Atrophic Vaginitis  Atrophic vaginitis is a condition in which the tissues that line the vagina become dry and thin. This condition is most common in women who have stopped having regular menstrual periods (are in menopause). This usually starts when a woman is 45 to 55 years old. That is the time when a woman's estrogen levels begin to decrease. Estrogen is a female hormone.  It helps to keep the tissues of the vagina moist. It stimulates the vagina to produce a clear fluid that lubricates the vagina for sex. This fluid also protects the vagina from infection. Lack of estrogen can cause the lining of the vagina to get thinner and dryer. The vagina may also shrink in size. It may become less elastic. Atrophic vaginitis tends to get worse over time as a woman's estrogen level drops. What are the causes? This condition is caused by the normal drop in estrogen that happens around the time of menopause. What increases the risk? Certain conditions or situations may lower a woman's estrogen level, leading to a higher risk for atrophic vaginitis. You are more likely to develop this condition if: You are taking medicines that block estrogen. You have had your ovaries removed. You are being treated for cancer with radiation or medicines (chemotherapy). You have given birth or are breastfeeding. You are older than age 50. You smoke. What are the signs or symptoms? Symptoms of this condition include: Pain, soreness, a feeling of pressure, or bleeding during sex (dyspareunia). Vaginal burning, irritation, or itching. Pain or bleeding when a speculum is used in a vaginal exam. Having burning pain while urinating. Vaginal discharge. In some cases, there are no symptoms. How is this diagnosed? This condition is diagnosed based on your medical history and a physical exam. This will include a pelvic exam that checks the vaginal tissues. Though rare, you may also have other tests, including: A urine test. A test that checks the acid balance in your vagina (acid balance test). How is this treated? Treatment for this condition depends on how severe your symptoms are. Treatment may include: Using an over-the-counter vaginal lubricant before sex. Using a long-acting vaginal moisturizer. Using low-dose estrogen for moderate to severe symptoms that do not respond to other treatments.  Options include creams, tablets, and inserts (vaginal rings). Before you use a vaginal estrogen, tell your health care provider if you have a history of: Breast cancer. Endometrial cancer. Blood clots. If you are not sexually active and your symptoms are very mild, you may not need treatment. Follow these instructions at home: Medicines Take over-the-counter and prescription medicines only as told by your health care provider. Do not use herbal or alternative medicines unless your health care provider says that you can. Use over-the-counter creams, lubricants, or moisturizers for dryness only as told by your health care provider. General instructions If your atrophic vaginitis is caused by menopause, discuss all of your menopause symptoms and treatment options with your health care provider. Do not douche. Do not use products that can make your vagina dry. These include: Scented feminine sprays. Scented tampons. Scented soaps. Vaginal sex can help to improve blood flow and elasticity of vaginal tissue. If you choose to have sex and it hurts, try using a water-soluble lubricant or moisturizer right before having sex. Contact   a health care provider if: Your discharge looks different than normal. Your vagina has an unusual smell. You have new symptoms. Your symptoms do not improve with treatment. Your symptoms get worse. Summary Atrophic vaginitis is a condition in which the tissues that line the vagina become dry and thin. It is most common in women who have stopped having regular menstrual periods (are in menopause). Treatment options include using vaginal lubricants and low-dose vaginal estrogen. Contact a health care provider if your vagina has an unusual smell, or if your symptoms get worse or do not improve after treatment. This information is not intended to replace advice given to you by your health care provider. Make sure you discuss any questions you have with your health care  provider. Document Revised: 04/26/2020 Document Reviewed: 04/26/2020 Elsevier Patient Education  2023 [employer]vier Inc.  

## 2022-02-24 ENCOUNTER — Telehealth: Payer: Self-pay | Admitting: Licensed Clinical Social Worker

## 2022-02-24 NOTE — Telephone Encounter (Signed)
Scheduled appt per 4/14 referral. Pt is aware of appt date and time. Pt is aware to arrive 15 mins prior to appt time and to bring and updated insurance card. Pt is aware of appt location.   ?

## 2022-04-11 NOTE — Progress Notes (Signed)
REFERRING PROVIDER: Nunzio Cobbs, MD [address] [place],  [place] [address]  PRIMARY PROVIDER:  Nickola Major, MD  PRIMARY REASON FOR VISIT:  Encounter Diagnosis  Name Primary?   Family history of breast cancer     HISTORY OF PRESENT ILLNESS:   Michelle Love, a 56 y.o. female, was seen for a [facility] cancer genetics consultation at the request of Dr. Yisroel Ramming due to a family history of breast cancer.  Michelle Love presents to clinic today to discuss the possibility of a hereditary predisposition to cancer, to discuss genetic testing, and to further clarify her future cancer risks, as well as potential cancer risks for family members.   Michelle Love is a 56 y.o. female with no personal history of cancer.    CANCER HISTORY:  Oncology History   No history exists.    RISK FACTORS:  Mammogram within the last year: yes; most recent December 2022; category D breast density  Breast MRI last year.   Number of breast biopsies: 1; ~5 years ago; benign per patient Colonoscopy: yes;  August 2022; precancerous sessile serrated polyp; f/u planned for 5 year interval . Hysterectomy: no. Ovaries intact: yes.  Menarche was at age 6.  First live birth at age 45.  OCP use for approximately  10  years.  HRT use: 0 years. Dermatology annually.  Any excessive radiation exposure in the past: no  Past Medical History:  Diagnosis Date   Allergy    Anxiety    GERD (gastroesophageal reflux disease)    Hx of colonic polyp 06/20/2021   7 mm ssp   Viral meningitis 11/10/2004    Past Surgical History:  Procedure Laterality Date   BREAST BIOPSY Left 05/29/2014   benign   CESAREAN SECTION     COLONOSCOPY W/ POLYPECTOMY  06/20/2021   7 mm ssp    FAMILY HISTORY:  We obtained a detailed, 4-generation family history.  Significant diagnoses are listed below: Family History  Problem Relation Age of Onset   Colon polyps Love    Breast cancer Love 65    Skin cancer Father        SCC; dx after 14; top of head, inside ear, leg   Breast cancer Maternal Aunt 22   Breast cancer Maternal Grandmother        dx 27s   Leukemia Maternal Grandfather        chronic   Breast cancer Maternal Great-grandmother        MGM's Love; dx unknown age   Colon cancer Paternal Great-grandmother        PGF's Love; dx 36s    Michelle Love's Love had negative genetic testing in 2016 through the [facility].  Testing included sequencing and rearrangement analysis of the following genes: APC, ATM, BARD1, BMPR1A, BRCA1, BRCA2, BRIP1, CDH1, CDK4, CDKN2A, CHEK2, EPCAM (large rearrangement only), MLH1, MSH2, MSH6,MUTYH, NBN, PALB2, PMS2, PTEN, RAD51C, RAD51D, SMAD4, STK11, TP53.  Michelle Love is unaware of previous family history of genetic testing for hereditary cancer risks.  Michelle Love reported a small amount of Ashkenazi Jewish ancestry in her maternal family.  There is no known consanguinity.  GENETIC COUNSELING ASSESSMENT: Michelle Love is a 56 y.o. female with a family history of breast cancer which is suggestive of a predisposition to cancer. We, therefore, discussed and recommended the following at today's visit.   DISCUSSION: We discussed that 5 - 10% of cancer is hereditary, with most cases  of hereditary breast cancer associated with mutations in BRCA1/2.  There are other genes that can be associated with hereditary breast cancer syndromes.  We discussed that testing is beneficial for several reasons, including knowing about other cancer risks, identifying potential screening and risk-reduction options that may be appropriate, and to understanding if other family members could be at risk for cancer and allowing them to undergo genetic testing.  With permission of Michelle Love, we reviewed her negative genetic testing results.  We discussed that no pathogenic variants were detected in her testing.  Even though a pathogenic variant was not identified in  her Love, possible explanations for the cancer in the family may include: There may be no hereditary risk for cancer in the family. The cancers in Michelle Love and other maternal family members may be sporadic/familial or due to other genetic and environmental factors. There may be a gene mutation in one of these genes that current testing methods cannot detect but that chance is small. There could be another gene that has not yet been discovered, or that we have not yet tested, that is responsible for the cancer diagnoses in the family.   Therefore, it is important to remain in touch with cancer genetics in the future so that we can continue to offer Michelle Love the most up to date genetic testing. Because Michelle Love did not have an identifiable mutation in a cancer predisposition gene included on her genetics panel, her children could not have inherited a known mutation from her in one of these genes.  We discussed with Michelle Love that the paternal family history or maternal family history (given her Love had negative genetic testing) does not meet insurance or NCCN criteria for genetic testing.  We feel she is at low risk to harbor an identifiable gene mutation associated with such a condition. Thus, we did not recommend any genetic testing, at this time, and recommended Michelle Love continue to follow the cancer screening guidelines given by her primary healthcare provider.    Michelle Love has been determined to be at high risk for breast cancer.  Her lifetime risk for breast cancer based on Tyrer-Cuzick risk model is approximately 39%.  This risk estimate can change over time and may be repeated to reflect new information in her personal or family history in the future.  For females with a greater than 20% lifetime risk of breast cancer, the [employer]  (NCCN) recommends the following:  Clinical encounter every 6-12 months to begin when identified as being at increased  risk, but not before age 51  Annual mammograms. Tomosynthesis is recommended starting 10 years earlier than the youngest breast cancer diagnosis in the family or at age 25 (whichever comes first), but not before age 77   Annual breast MRI starting 10 years earlier than the youngest breast cancer diagnosis in the family or at age 37 (whichever comes first), but not before age 69      We, therefore, discussed that it is reasonable for Michelle Love to be followed by a high-risk breast cancer clinic; in addition to a yearly mammogram and physical exam by a healthcare provider, she should discuss the usefulness of an annual breast MRI with  her referring provider and/or the high-risk clinic providers.  Michelle Love is interested in a referral to the high risk breast clinic, and a referral has been placed.    PLAN: Given her Love's previous negative genetic testing, Ms. Chalfin did not pursue  genetic testing at today's visit.  A referral was placed for the high risk breast clinic due due to her estimated lifetime risk for breast cancer based on her Tyrer Cuzick score.  We discussed considering annual breast MRIs in addition to annual mammograms.   Based on Ms. Blomquist's family history, we recommended her maternal aunt, who was diagnosed with breast cancer at age 89, have genetic counseling and testing. Ms. Mcginty will let us know if we can be of any assistance in coordinating genetic counseling and/or testing for this family member.   Lastly, we encouraged Ms. Cressler to remain in contact with cancer genetics so that we can continuously update the family history and inform her of any changes in cancer genetics and testing that may be of benefit for this family.   Ms. Sane questions were answered to her satisfaction today. Our contact information was provided should additional questions or concerns arise. Thank you for the referral and allowing [place] to share in the care of your patient.   Emeka Lindner M. Joette Catching, [place],  [facility] Genetic Counselor [email or web address] (P) [phone]   The patient was seen for a total of 40 minutes in face-to-face genetic counseling.  The patient was seen alone.  Drs. Lindi Adie and/or Burr Medico were available to discuss this case as needed.  _______________________________________________________________________ For Office Staff:  Number of people involved in session: 1 Was an Intern/ student involved with case: no

## 2022-04-14 ENCOUNTER — Other Ambulatory Visit: Payer: Self-pay

## 2022-04-14 ENCOUNTER — Encounter: Payer: Self-pay | Admitting: Genetic Counselor

## 2022-04-14 ENCOUNTER — Inpatient Hospital Stay: Payer: Commercial Managed Care - PPO | Attending: Optometry | Admitting: Genetic Counselor

## 2022-04-14 ENCOUNTER — Inpatient Hospital Stay: Payer: Commercial Managed Care - PPO

## 2022-04-14 ENCOUNTER — Other Ambulatory Visit: Payer: Self-pay | Admitting: Genetic Counselor

## 2022-04-14 DIAGNOSIS — Z803 Family history of malignant neoplasm of breast: Secondary | ICD-10-CM | POA: Diagnosis not present

## 2022-04-14 HISTORY — DX: Family history of malignant neoplasm of breast: Z80.3

## 2022-04-14 NOTE — Addendum Note (Signed)
Addended by: Ignacia Bayley on: 04/14/2022 10:12 AM   Modules accepted: Orders

## 2022-04-15 ENCOUNTER — Telehealth: Payer: Self-pay | Admitting: Hematology and Oncology

## 2022-04-15 NOTE — Telephone Encounter (Signed)
Scheduled appt per 6/5 referral. Pt is aware of appt date and time. Pt is aware to arrive 15 mins prior to appt time and to bring and updated insurance card. Pt is aware of appt location.   

## 2022-05-29 ENCOUNTER — Other Ambulatory Visit: Payer: Self-pay

## 2022-05-29 ENCOUNTER — Inpatient Hospital Stay: Payer: Commercial Managed Care - PPO | Attending: Optometry | Admitting: Hematology and Oncology

## 2022-05-29 DIAGNOSIS — Z800 Family history of malignant neoplasm of digestive organs: Secondary | ICD-10-CM | POA: Diagnosis not present

## 2022-05-29 DIAGNOSIS — Z1501 Genetic susceptibility to malignant neoplasm of breast: Secondary | ICD-10-CM | POA: Diagnosis not present

## 2022-05-29 DIAGNOSIS — Z803 Family history of malignant neoplasm of breast: Secondary | ICD-10-CM

## 2022-05-29 NOTE — Assessment & Plan Note (Signed)
1.  Breast cancer risk: Tyrer-Cuzick: 10-year risk: 9% (average risk 2.9%) Lifetime risk: 25% (average risk 8.6%) Baker Janus model 5-year risk: 3.4% (average risk 1.4%) Baker Janus model lifetime risk: 22.7% (average risk 10.4%)  2. Risk reduction strategies: We discussed different risk reducing strategies for future breast cancer risk. We discussed chemoprevention and lifestyle modification.   Patient would be a good candidate for chemoprevention with  tamoxifen or Evista  With tamoxifen side effects would include but not limited to cataract axilla radiation, thrombosis, uterine malignancy, hot flashes mood swings. With Evista patient would and could experience mood swings hot flashes aches and pains and possibly thrombosis.  We discussed the results of TAM-01 clinical trial that showed tamoxifen is effective even at 5 mg.  3. Lifestyle modification: We discussed different interventions exercise at least 5 days a week including both aerobic as well as weight-bearing exercises and strength training. we discussed dietary modification including increasing number of servings of fruit and vegetables as well as decreased in number of servings of meat. We discussed the importance of maintaining a good BMI. Certainly patient eliminating or limiting alcohol consumption.    4. Surveillance: Patient certainly would be a good candidate for ongoing mammograms on a yearly basis.  She is also eligible for breast MRIs annually.  However it is also reasonable to get the breast MRIs every other year.   She will inform me if she wishes to go on tamoxifen therapy. I discussed with her that if she were to go on tamoxifen then I will need to see her annually.  Otherwise she will continue follow-ups with her primary care physician who are ordering her mammograms and breast MRIs.

## 2022-05-29 NOTE — Progress Notes (Signed)
[place] CONSULT NOTE  Patient Care Team: Nickola Major, MD as PCP - General (Family Medicine)  CHIEF COMPLAINTS/PURPOSE OF CONSULTATION:  High risk breast cancer  HISTORY OF PRESENTING ILLNESS:  Michelle Love 56 y.o. female is here because of high risk breast cancer.  Patient has extensive family history of breast cancer and went to see genetic counselors.  Based on low probability for genetic abnormality she decided not to do genetics.  Her mother was tested and was found to be negative for genetic mutations.  She had breast mammogram and breast MRI performed last year both of which were negative for breast cancer.  I reviewed her records extensively and collaborated the history with the patient.   MEDICAL HISTORY:  Past Medical History:  Diagnosis Date   Allergy    Anxiety    Family history of breast cancer 04/14/2022   GERD (gastroesophageal reflux disease)    Hx of colonic polyp 06/20/2021   7 mm ssp   Viral meningitis 11/10/2004    SURGICAL HISTORY: Past Surgical History:  Procedure Laterality Date   BREAST BIOPSY Left 05/29/2014   benign   CESAREAN SECTION     COLONOSCOPY W/ POLYPECTOMY  06/20/2021   7 mm ssp    SOCIAL HISTORY: Social History   Socioeconomic History   Marital status: Married    Spouse name: Not on file   Number of children: Not on file   Years of education: Not on file   Highest education level: Not on file  Occupational History   Not on file  Tobacco Use   Smoking status: Never   Smokeless tobacco: Never  Vaping Use   Vaping Use: Never used  Substance and Sexual Activity   Alcohol use: Yes    Alcohol/week: 0.0 standard drinks of alcohol    Comment: twice weekly   Drug use: No   Sexual activity: Yes    Partners: Male    Birth control/protection: Post-menopausal    Comment: 1st intercourse 56 yo-Fewer than 5 partners  Other Topics Concern   Not on file  Social History Narrative   Not on file   Social  Determinants of Health   Financial Resource Strain: Not on file  Food Insecurity: Not on file  Transportation Needs: Not on file  Physical Activity: Not on file  Stress: Not on file  Social Connections: Not on file  Intimate Partner Violence: Not on file    FAMILY HISTORY: Family History  Problem Relation Age of Onset   Colon polyps Mother    Breast cancer Mother 61   Diabetes Father        type 2   Hypertension Father    Skin cancer Father        SCC; dx after 34; top of head, inside ear, leg   Diabetes Brother        type 1   Hypertension Brother    Hyperlipidemia Brother    Breast cancer Maternal Aunt 74   Breast cancer Maternal Grandmother        dx 73s   Leukemia Maternal Grandfather        chronic   Hypertension Paternal Grandmother    Stroke Paternal Grandmother    Breast cancer Maternal Great-grandmother        MGM's mother; dx unknown age   Colon cancer Paternal Great-grandmother        PGF's mother; dx 2s   Esophageal cancer Neg Hx    Stomach cancer  Neg Hx    Rectal cancer Neg Hx     ALLERGIES:  is allergic to codeine.  MEDICATIONS:  Current Outpatient Medications  Medication Sig Dispense Refill   famotidine (PEPCID) 10 MG tablet Take 10 mg by mouth 2 (two) times daily.     No current facility-administered medications for this visit.    REVIEW OF SYSTEMS:   Constitutional: Denies fevers, chills or abnormal night sweats Breast:  Denies any palpable lumps or discharge All other systems were reviewed with the patient and are negative.  PHYSICAL EXAMINATION: ECOG PERFORMANCE STATUS: 0 - Asymptomatic  Vitals:   05/29/22 1548  BP: 119/75  Pulse: 79  Resp: 18  Temp: 97.8 F (36.6 C)  SpO2: 99%   Filed Weights   05/29/22 1548  Weight: 169 lb 14.4 oz (77.1 kg)      LABORATORY DATA:  I have reviewed the data as listed Lab Results  Component Value Date   WBC 4.1 02/11/2018   HGB 13.2 02/11/2018   HCT 38.2 02/11/2018   MCV 85.7  02/11/2018   PLT 253 02/11/2018   Lab Results  Component Value Date   NA 137 02/11/2018   K 4.3 02/11/2018   CL 103 02/11/2018   CO2 30 02/11/2018    RADIOGRAPHIC STUDIES: I have personally reviewed the radiological reports and agreed with the findings in the report.  ASSESSMENT AND PLAN:  Family history of breast cancer 1.  Breast cancer risk: Tyrer-Cuzick: 10-year risk: 9% (average risk 2.9%) Lifetime risk: 25% (average risk 8.6%) Baker Janus model 5-year risk: 3.4% (average risk 1.4%) Baker Janus model lifetime risk: 22.7% (average risk 10.4%)  2. Risk reduction strategies: We discussed different risk reducing strategies for future breast cancer risk. We discussed chemoprevention and lifestyle modification.   Patient would be a good candidate for chemoprevention with  tamoxifen or Evista  With tamoxifen side effects would include but not limited to cataract axilla radiation, thrombosis, uterine malignancy, hot flashes mood swings. With Evista patient would and could experience mood swings hot flashes aches and pains and possibly thrombosis.  We discussed the results of TAM-01 clinical trial that showed tamoxifen is effective even at 5 mg.  3. Lifestyle modification: We discussed different interventions exercise at least 5 days a week including both aerobic as well as weight-bearing exercises and strength training. we discussed dietary modification including increasing number of servings of fruit and vegetables as well as decreased in number of servings of meat. We discussed the importance of maintaining a good BMI. Certainly patient eliminating or limiting alcohol consumption.    4. Surveillance: Patient certainly would be a good candidate for ongoing mammograms on a yearly basis.  She is also eligible for breast MRIs annually.  However it is also reasonable to get the breast MRIs every other year.   She will inform me if she wishes to go on tamoxifen therapy. I discussed with her that if she  were to go on tamoxifen then I will need to see her annually.  Otherwise she will continue follow-ups with her primary care physician who are ordering her mammograms and breast MRIs.  All questions were answered. The patient knows to call the clinic with any problems, questions or concerns.    Harriette Ohara, MD 05/29/22

## 2023-05-19 ENCOUNTER — Other Ambulatory Visit: Payer: Self-pay | Admitting: Obstetrics and Gynecology

## 2023-05-19 DIAGNOSIS — Z1231 Encounter for screening mammogram for malignant neoplasm of breast: Secondary | ICD-10-CM

## 2023-09-03 IMAGING — MR MR BREAST BILAT WO/W CM
8 of 12 series · 33 of 48 positions shown · IV contrast (7 ML GADAVIST)
Comparison: 02/12/2021 and prior mammograms.  07/01/2017 breast MR.

CLINICAL DATA: 55-year-old female with high lifetime risk for
developing breast cancer, for screening breast MRI.

EXAM:
BILATERAL BREAST MRI WITH AND WITHOUT CONTRAST
TECHNIQUE: Multiplanar, multisequence MR images of both breasts were obtained
prior to and following the intravenous administration of 7 ml of
Gadavist

[Series 2: t2_tirm_tra ipat (a-p) · axial · 3.0mm · 0.70mm/px · 1 of 55 slices shown]
[im 1/55]
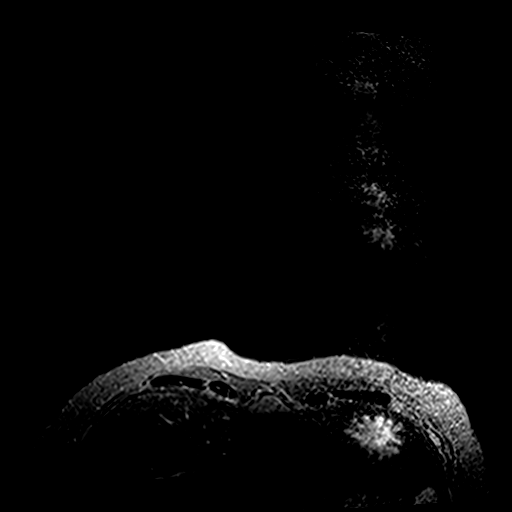

[Series 3: fl3d pre-cm no · axial · non-contrast · 1.2mm · 0.89mm/px · z∈[-75,+96]mm · 5 of 144 slices shown]
[im 1/144]
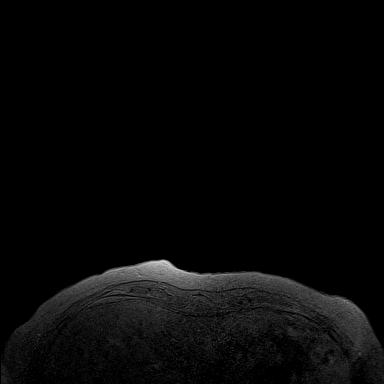
[im 36/144]
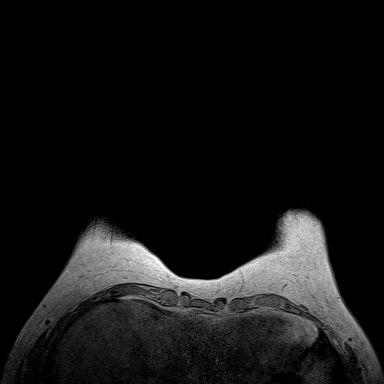
[im 72/144]
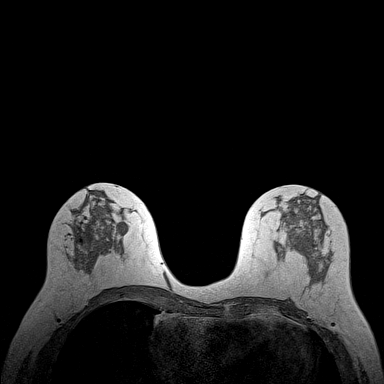
[im 108/144]
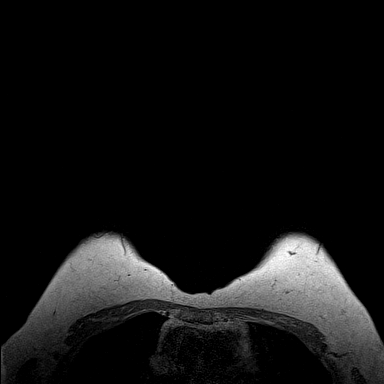
[im 144/144]
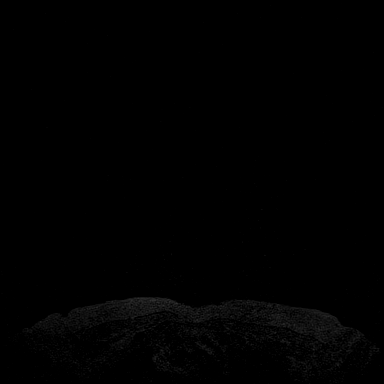

[Series 4: fl3d pre-cm · axial · non-contrast · 1.2mm · 0.89mm/px · z∈[-75,+96]mm · 5 of 144 slices shown]
[im 1/144]
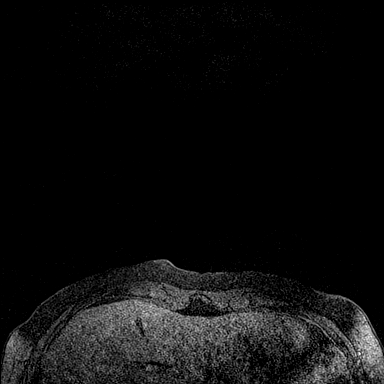
[im 36/144]
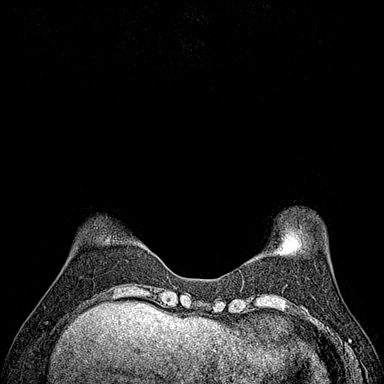
[im 72/144]
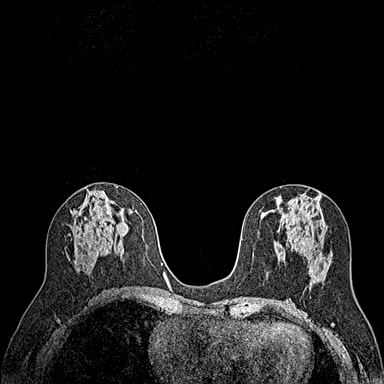
[im 108/144]
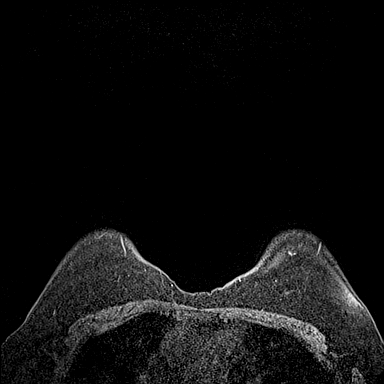
[im 144/144]
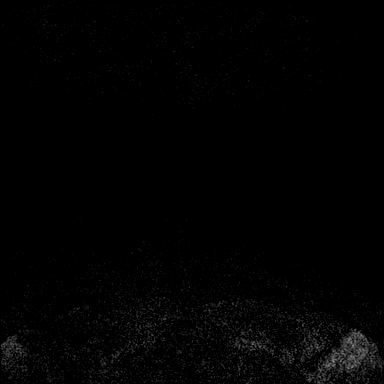

[Series 5: fl3d post-cm 20 · axial · 1.2mm · 0.89mm/px · z∈[-75,+96]mm · 5 of 144 slices shown (1 of 3)]
[im 1/144]
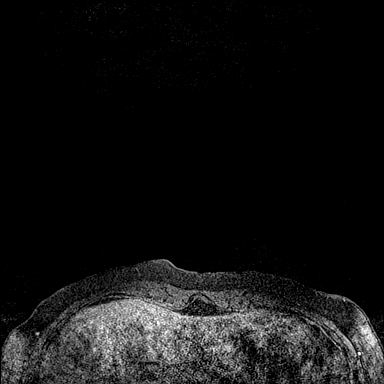
[im 36/144]
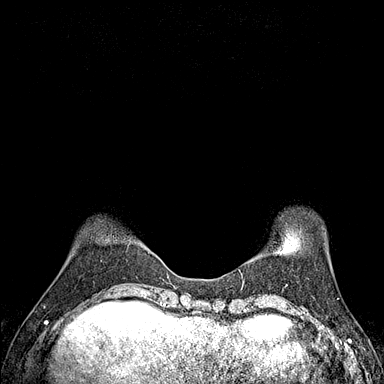
[im 72/144]
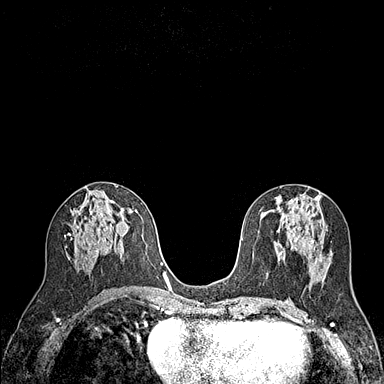
[im 108/144]
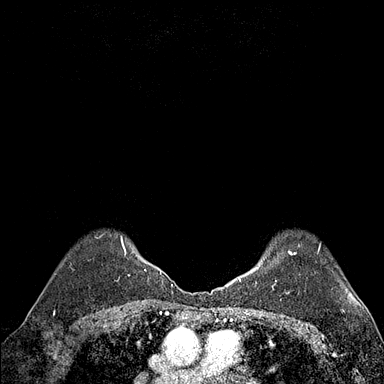
[im 144/144]
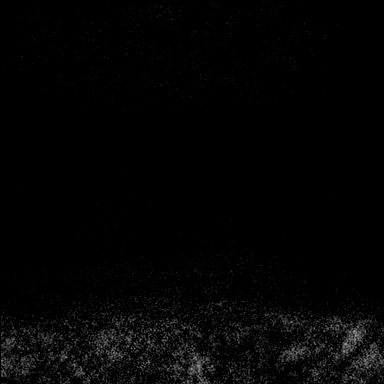

[Series 6: fl3d post-cm 20 · axial · 1.2mm · 0.89mm/px · z∈[-75,+96]mm · 5 of 144 slices shown (2 of 3)]
[im 1/144]
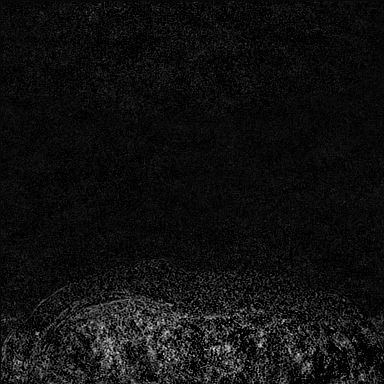
[im 36/144]
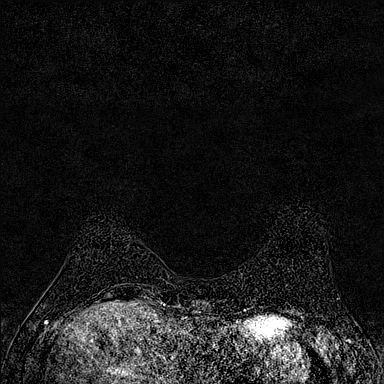
[im 72/144]
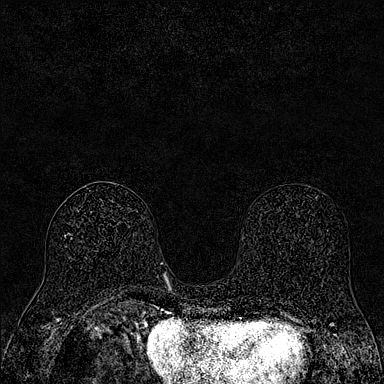
[im 108/144]
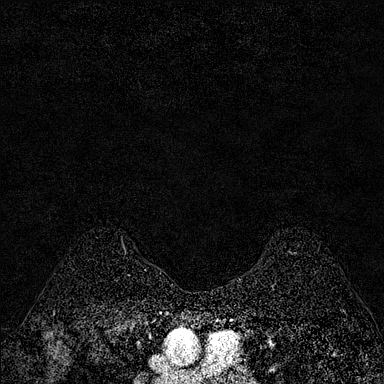
[im 144/144]
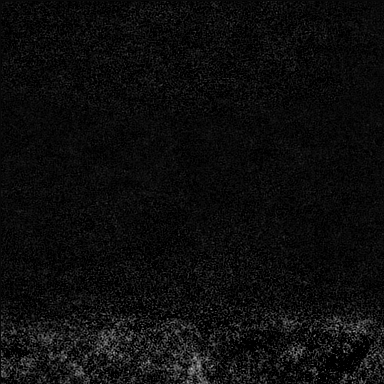

[Series 7: fl3d post-cm 20 · axial · 172.8mm · 0.89mm/px · 1 of 1 slices shown (3 of 3)]
[im 1/1]
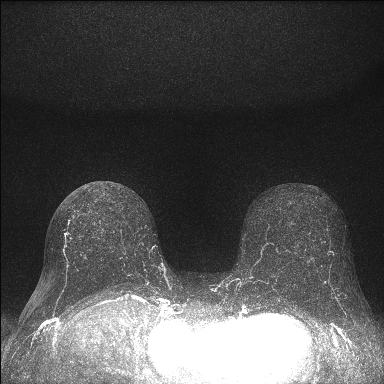

[Series 8: fl3d post-cm 3 · axial · 1.2mm · 0.89mm/px · z∈[-75,+96]mm · 6 of 144 slices shown (1 of 2)]
[im 1/144]
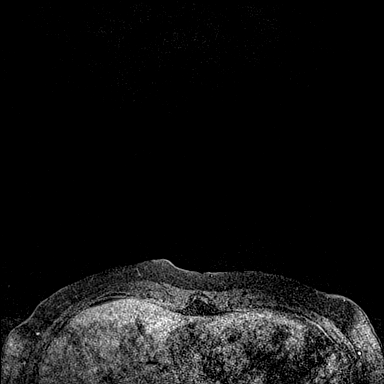
[im 29/144]
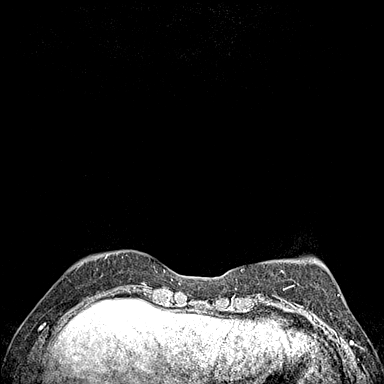
[im 58/144]
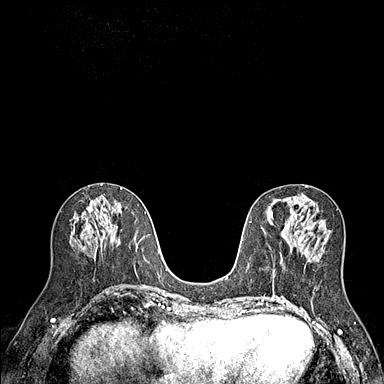
[im 86/144]
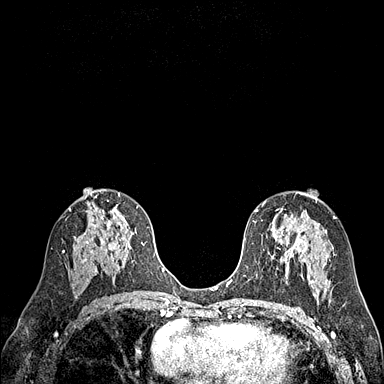
[im 115/144]
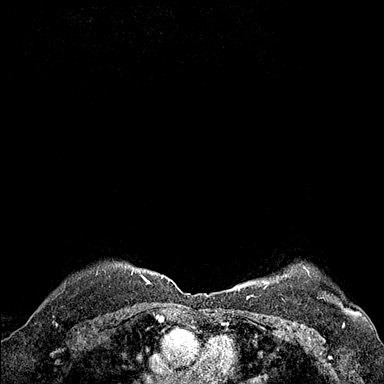
[im 144/144]
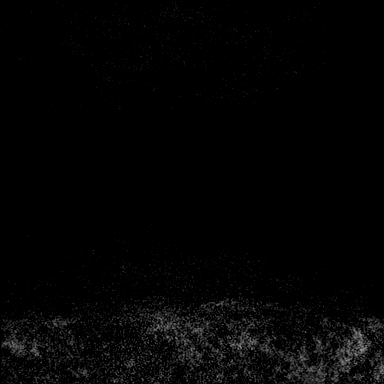

[Series 9: fl3d post-cm 3 · axial · 1.2mm · 0.89mm/px · z∈[-75,+61]mm · 5 of 144 slices shown (2 of 2)]
[im 1/144]
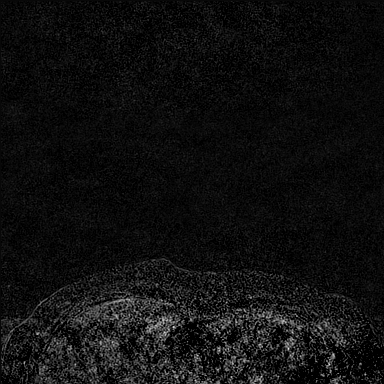
[im 29/144]
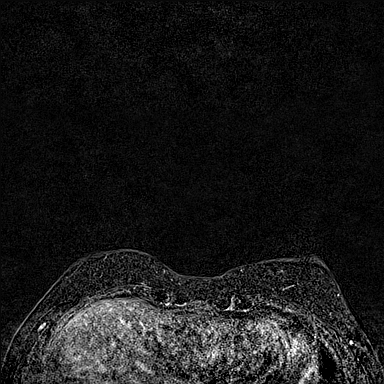
[im 58/144]
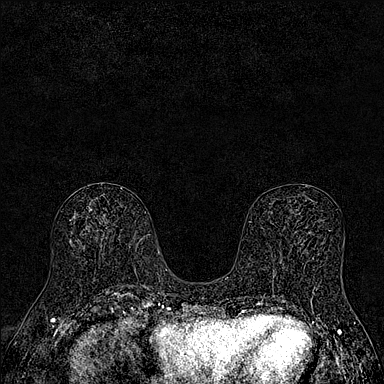
[im 86/144]
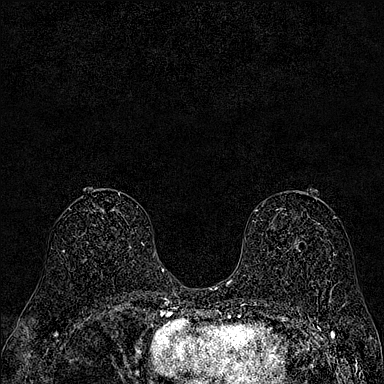
[im 115/144]
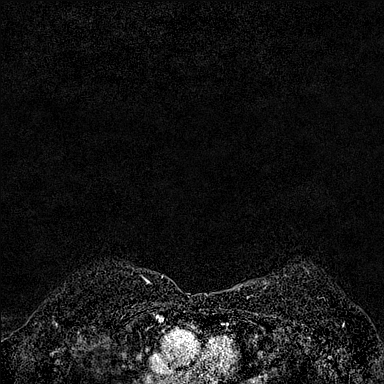

[33 of 48 positions shown; findings below may reference images not displayed]

Three-dimensional MR images were rendered by post-processing of the
original MR data on an independent workstation. The
three-dimensional MR images were interpreted, and findings are
reported in the following complete MRI report for this study. Three
dimensional images were evaluated at the independent interpreting
workstation using the DynaCAD thin client.
FINDINGS: Breast composition: c. Heterogeneous fibroglandular tissue.

Background parenchymal enhancement: Minimal

Right breast: No suspicious mass or worrisome enhancement. Small
scattered cysts and foci are again noted.

Left breast: No suspicious mass or worrisome enhancement. Small
scattered cysts and foci are again noted.

Lymph nodes: No abnormal appearing lymph nodes.

Ancillary findings:  None.
IMPRESSION: No MR evidence of breast malignancy.

RECOMMENDATION:
Bilateral screening mammogram in 4 months to resume annual mammogram
schedule.

Bilateral screening breast MRI as clinically indicated. The American
Cancer Society recommends annual bilateral screening breast MRI and
annual bilateral screening mammograms if the patient's lifetime risk
of developing breast cancer exceeds 20%.

BI-RADS CATEGORY  2: Benign.

## 2023-09-05 ENCOUNTER — Other Ambulatory Visit (HOSPITAL_BASED_OUTPATIENT_CLINIC_OR_DEPARTMENT_OTHER): Payer: Self-pay

## 2023-09-05 MED ORDER — SHINGRIX 50 MCG/0.5ML IM SUSR
INTRAMUSCULAR | 0 refills | Status: AC
Start: 2023-09-05 — End: ?
  Filled 2023-09-05: qty 0.5, 1d supply, fill #0

## 2023-12-21 ENCOUNTER — Other Ambulatory Visit (HOSPITAL_BASED_OUTPATIENT_CLINIC_OR_DEPARTMENT_OTHER): Payer: Self-pay

## 2023-12-21 MED ORDER — FLULAVAL 0.5 ML IM SUSY: PREFILLED_SYRINGE | INTRAMUSCULAR | 0 refills | Status: AC

## 2024-05-16 ENCOUNTER — Other Ambulatory Visit: Payer: Self-pay | Admitting: Obstetrics and Gynecology

## 2024-05-16 DIAGNOSIS — Z803 Family history of malignant neoplasm of breast: Secondary | ICD-10-CM

## 2024-05-17 ENCOUNTER — Other Ambulatory Visit: Payer: Self-pay | Admitting: Obstetrics and Gynecology

## 2024-05-17 DIAGNOSIS — Z1231 Encounter for screening mammogram for malignant neoplasm of breast: Secondary | ICD-10-CM

## 2024-05-19 ENCOUNTER — Ambulatory Visit

## 2024-05-26 ENCOUNTER — Ambulatory Visit
Admission: RE | Admit: 2024-05-26 | Discharge: 2024-05-26 | Disposition: A | Discharge status: Home / Self Care | Source: Ambulatory Visit | Attending: Obstetrics and Gynecology | Admitting: Obstetrics and Gynecology

## 2024-05-26 DIAGNOSIS — Z1231 Encounter for screening mammogram for malignant neoplasm of breast: Secondary | ICD-10-CM

## 2024-07-16 ENCOUNTER — Other Ambulatory Visit

## 2024-08-03 ENCOUNTER — Ambulatory Visit
Admission: RE | Admit: 2024-08-03 | Discharge: 2024-08-03 | Disposition: A | Discharge status: Home / Self Care | Source: Ambulatory Visit | Attending: Obstetrics and Gynecology | Admitting: Obstetrics and Gynecology

## 2024-08-03 DIAGNOSIS — Z803 Family history of malignant neoplasm of breast: Secondary | ICD-10-CM

## 2024-08-03 MED ORDER — GADOPICLENOL 0.5 MMOL/ML IV SOLN
8.0000 mL | Freq: Once | INTRAVENOUS | Status: AC | PRN
  Administered 2024-08-03: 8 mL via INTRAVENOUS
